# Patient Record
Sex: Male | Born: 1945 | Race: White | Hispanic: No | Marital: Married | Smoking: Never smoker
Health system: Southern US, Community
[De-identification: ages and names within clinical notes are randomized; demographics above are authoritative.]

## PROBLEM LIST (undated history)

## (undated) DIAGNOSIS — I4510 Unspecified right bundle-branch block: Secondary | ICD-10-CM

## (undated) DIAGNOSIS — I639 Cerebral infarction, unspecified: Secondary | ICD-10-CM

## (undated) DIAGNOSIS — E119 Type 2 diabetes mellitus without complications: Secondary | ICD-10-CM

## (undated) DIAGNOSIS — E785 Hyperlipidemia, unspecified: Secondary | ICD-10-CM

## (undated) DIAGNOSIS — E042 Nontoxic multinodular goiter: Secondary | ICD-10-CM

## (undated) DIAGNOSIS — I10 Essential (primary) hypertension: Secondary | ICD-10-CM

## (undated) HISTORY — DX: Cerebral infarction, unspecified: I63.9

## (undated) HISTORY — DX: Nontoxic multinodular goiter: E04.2

## (undated) HISTORY — PX: SPINE SURGERY: SHX786

## (undated) HISTORY — DX: Unspecified right bundle-branch block: I45.10

## (undated) HISTORY — PX: TONSILLECTOMY: SUR1361

## (undated) HISTORY — DX: Type 2 diabetes mellitus without complications: E11.9

## (undated) HISTORY — DX: Essential (primary) hypertension: I10

## (undated) HISTORY — DX: Hyperlipidemia, unspecified: E78.5

---

## 1998-04-12 DIAGNOSIS — I639 Cerebral infarction, unspecified: Secondary | ICD-10-CM

## 1998-04-12 HISTORY — DX: Cerebral infarction, unspecified: I63.9

## 2009-03-11 ENCOUNTER — Encounter: Admission: RE | Admit: 2009-03-11 | Discharge: 2009-03-11 | Payer: Self-pay | Admitting: Family Medicine

## 2009-12-17 ENCOUNTER — Encounter: Admission: RE | Admit: 2009-12-17 | Discharge: 2009-12-17 | Payer: Self-pay | Admitting: Family Medicine

## 2009-12-29 ENCOUNTER — Encounter: Admission: RE | Admit: 2009-12-29 | Discharge: 2009-12-29 | Payer: Self-pay | Admitting: Family Medicine

## 2010-08-10 ENCOUNTER — Ambulatory Visit: Payer: Federal, State, Local not specified - PPO | Attending: Family Medicine | Admitting: Physical Therapy

## 2010-08-10 DIAGNOSIS — R269 Unspecified abnormalities of gait and mobility: Secondary | ICD-10-CM | POA: Insufficient documentation

## 2010-08-10 DIAGNOSIS — R262 Difficulty in walking, not elsewhere classified: Secondary | ICD-10-CM | POA: Insufficient documentation

## 2010-08-10 DIAGNOSIS — IMO0001 Reserved for inherently not codable concepts without codable children: Secondary | ICD-10-CM | POA: Insufficient documentation

## 2010-08-10 DIAGNOSIS — M6281 Muscle weakness (generalized): Secondary | ICD-10-CM | POA: Insufficient documentation

## 2010-08-10 DIAGNOSIS — R5381 Other malaise: Secondary | ICD-10-CM | POA: Insufficient documentation

## 2010-09-14 ENCOUNTER — Other Ambulatory Visit: Payer: Self-pay | Admitting: Internal Medicine

## 2010-09-14 DIAGNOSIS — E042 Nontoxic multinodular goiter: Secondary | ICD-10-CM

## 2010-09-17 ENCOUNTER — Ambulatory Visit
Admission: RE | Admit: 2010-09-17 | Discharge: 2010-09-17 | Disposition: A | Discharge status: Home / Self Care | Payer: Federal, State, Local not specified - PPO | Source: Ambulatory Visit | Attending: Internal Medicine | Admitting: Internal Medicine

## 2010-09-17 DIAGNOSIS — E042 Nontoxic multinodular goiter: Secondary | ICD-10-CM

## 2013-12-25 ENCOUNTER — Ambulatory Visit: Payer: Federal, State, Local not specified - PPO

## 2014-01-08 ENCOUNTER — Ambulatory Visit: Payer: Federal, State, Local not specified - PPO | Attending: Family Medicine | Admitting: Physical Therapy

## 2014-01-08 DIAGNOSIS — R269 Unspecified abnormalities of gait and mobility: Secondary | ICD-10-CM | POA: Insufficient documentation

## 2014-01-08 DIAGNOSIS — M25519 Pain in unspecified shoulder: Secondary | ICD-10-CM | POA: Diagnosis not present

## 2014-01-08 DIAGNOSIS — M2560 Stiffness of unspecified joint, not elsewhere classified: Secondary | ICD-10-CM | POA: Diagnosis not present

## 2014-01-08 DIAGNOSIS — IMO0001 Reserved for inherently not codable concepts without codable children: Secondary | ICD-10-CM | POA: Insufficient documentation

## 2014-01-24 ENCOUNTER — Ambulatory Visit: Payer: Federal, State, Local not specified - PPO | Attending: Family Medicine | Admitting: Physical Therapy

## 2014-01-24 DIAGNOSIS — M2560 Stiffness of unspecified joint, not elsewhere classified: Secondary | ICD-10-CM | POA: Diagnosis not present

## 2014-01-24 DIAGNOSIS — M25511 Pain in right shoulder: Secondary | ICD-10-CM | POA: Diagnosis not present

## 2014-01-24 DIAGNOSIS — R269 Unspecified abnormalities of gait and mobility: Secondary | ICD-10-CM | POA: Insufficient documentation

## 2014-01-24 DIAGNOSIS — Z5189 Encounter for other specified aftercare: Secondary | ICD-10-CM | POA: Diagnosis present

## 2014-01-29 ENCOUNTER — Ambulatory Visit: Payer: Federal, State, Local not specified - PPO | Admitting: Physical Therapy

## 2014-01-29 DIAGNOSIS — Z5189 Encounter for other specified aftercare: Secondary | ICD-10-CM | POA: Diagnosis not present

## 2014-01-31 ENCOUNTER — Ambulatory Visit: Payer: Federal, State, Local not specified - PPO | Admitting: Physical Therapy

## 2014-02-05 ENCOUNTER — Encounter: Payer: Federal, State, Local not specified - PPO | Admitting: Physical Therapy

## 2014-02-07 ENCOUNTER — Ambulatory Visit: Payer: Federal, State, Local not specified - PPO | Admitting: Physical Therapy

## 2014-02-12 ENCOUNTER — Encounter: Payer: Federal, State, Local not specified - PPO | Admitting: Physical Therapy

## 2014-02-14 ENCOUNTER — Encounter: Payer: Federal, State, Local not specified - PPO | Admitting: Physical Therapy

## 2014-02-19 ENCOUNTER — Ambulatory Visit: Payer: Federal, State, Local not specified - PPO | Attending: Family Medicine | Admitting: Physical Therapy

## 2014-02-19 DIAGNOSIS — R269 Unspecified abnormalities of gait and mobility: Secondary | ICD-10-CM | POA: Insufficient documentation

## 2014-02-19 DIAGNOSIS — Z5189 Encounter for other specified aftercare: Secondary | ICD-10-CM | POA: Insufficient documentation

## 2014-02-19 DIAGNOSIS — M2560 Stiffness of unspecified joint, not elsewhere classified: Secondary | ICD-10-CM | POA: Diagnosis not present

## 2014-02-19 DIAGNOSIS — M25511 Pain in right shoulder: Secondary | ICD-10-CM | POA: Insufficient documentation

## 2014-02-21 ENCOUNTER — Ambulatory Visit: Payer: Federal, State, Local not specified - PPO | Admitting: Physical Therapy

## 2014-10-03 ENCOUNTER — Ambulatory Visit (INDEPENDENT_AMBULATORY_CARE_PROVIDER_SITE_OTHER): Payer: Federal, State, Local not specified - PPO | Admitting: Neurology

## 2014-10-03 ENCOUNTER — Ambulatory Visit (INDEPENDENT_AMBULATORY_CARE_PROVIDER_SITE_OTHER): Payer: Self-pay | Admitting: Neurology

## 2014-10-03 DIAGNOSIS — G5601 Carpal tunnel syndrome, right upper limb: Secondary | ICD-10-CM

## 2014-10-03 NOTE — Progress Notes (Signed)
  [facility]    Provider:  Dr Lucia Gaskins Referring Provider: Ileana Ladd, MD Primary Care Physician:  [name], MD  HPI:  Charles Welch is a 69 y.o. male here as a referral from Dr. Modesto Charon for right CTS. Symptoms started 16 years ago. He has tingling and numbers in all the fingers. No grip weakness. Symptoms worse at night and early in the morning.   Summary:   Right median motor nerve showed delayed distal onset latency(6.1ms, N<4.3ms) and delayed F Response (36.52ms, N<61ms)  Right median sensory nerve showed prolonged distal peak latency (6.78ms, N<3.9) and reduced amplitude (5uV, N>10)  Right Ulnar ADM motor nerve within normal limits with normal F response latency Right Ulnar 5th digit sensory nerve within normal limits.   Right Radial 5th digit sensory nerve within normal limits.  The Right Lumbrical Comparison Motor nerve showed abnormal onset latency difference (Med Wrist-Uln Wrist, 0.5 ms, N<0.5) with a relative median delay.    Needle evaluation of the right Opponens Pollicis showed moderately increased spontaneous activity(positive sharp waves, fibrillation potentials), increased amplitude and reduced recruitment. The following muscles were within normal limits: right Deltoid, right Triceps, right Pronator teres, right First Dorsal Interosseous, right Extensor Indicis. Could not evaluate cervical  paraspinals due to previous surgery  Conclusion: There electrophysiologic evidence for severe right Carpal Tunnel Syndrome. No suggestion of polyneuropathy or cervical radiculopathy.   Naomie Dean, MD  [facility] [address] [place], [place] [address]  Phone [phone] Fax [phone]

## 2014-10-03 NOTE — Progress Notes (Signed)
See procedure note.

## 2014-10-06 NOTE — Procedures (Signed)
[id number]    Provider:  Dr Lucia Gaskins Referring Provider: Ileana Ladd, MD Primary Care Physician:  [name], MD  HPI:  Charles Welch is a 69 y.o. male here as a referral from Dr. Modesto Charon for right CTS. Symptoms started 16 years ago. He has tingling and numbers in all the fingers. No grip weakness. Symptoms worse at night and early in the morning.   Summary:   Right median motor nerve showed delayed distal onset latency(6.2ms, N<4.49ms) and delayed F Response (36.73ms, N<77ms)  Right median sensory nerve showed prolonged distal peak latency (6.52ms, N<3.9) and reduced amplitude (5uV, N>10)  Right Ulnar ADM motor nerve within normal limits with normal F response latency Right Ulnar 5th digit sensory nerve within normal limits.   Right Radial 5th digit sensory nerve within normal limits.  The Right Lumbrical Comparison Motor nerve showed abnormal onset latency difference (Med Wrist-Uln Wrist, 0.5 ms, N<0.5) with a relative median delay.    Needle evaluation of the right Opponens Pollicis showed moderately increased spontaneous activity(positive sharp waves, fibrillation potentials), increased amplitude and reduced recruitment. The following muscles were within normal limits: right Deltoid, right Triceps, right Pronator teres, right First Dorsal Interosseous, right Extensor Indicis. Could not evaluate cervical  paraspinals due to previous surgery  Conclusion: There electrophysiologic evidence for severe right Carpal Tunnel Syndrome. No suggestion of polyneuropathy or cervical radiculopathy.   Naomie Dean, MD  [facility] [address] [place], [place] [address]  Phone [phone] Fax [phone]

## 2015-06-30 ENCOUNTER — Ambulatory Visit: Payer: Federal, State, Local not specified - PPO | Admitting: Endocrinology

## 2015-07-29 ENCOUNTER — Encounter: Payer: Self-pay | Admitting: Internal Medicine

## 2015-07-29 ENCOUNTER — Other Ambulatory Visit (INDEPENDENT_AMBULATORY_CARE_PROVIDER_SITE_OTHER): Payer: Federal, State, Local not specified - PPO | Admitting: *Deleted

## 2015-07-29 ENCOUNTER — Ambulatory Visit (INDEPENDENT_AMBULATORY_CARE_PROVIDER_SITE_OTHER): Payer: Federal, State, Local not specified - PPO | Admitting: Internal Medicine

## 2015-07-29 VITALS — BP 154/74 | HR 85 | Temp 98.0°F | Resp 12 | Ht 76.0 in | Wt 176.0 lb

## 2015-07-29 DIAGNOSIS — E1159 Type 2 diabetes mellitus with other circulatory complications: Secondary | ICD-10-CM | POA: Diagnosis not present

## 2015-07-29 DIAGNOSIS — E118 Type 2 diabetes mellitus with unspecified complications: Secondary | ICD-10-CM

## 2015-07-29 DIAGNOSIS — IMO0002 Reserved for concepts with insufficient information to code with codable children: Secondary | ICD-10-CM | POA: Insufficient documentation

## 2015-07-29 DIAGNOSIS — E1165 Type 2 diabetes mellitus with hyperglycemia: Secondary | ICD-10-CM

## 2015-07-29 LAB — POCT GLYCOSYLATED HEMOGLOBIN (HGB A1C): Hemoglobin A1C: 10

## 2015-07-29 NOTE — Progress Notes (Signed)
Patient ID: Charles Welch, male   DOB: [birth year], 70 y.o.   MRN: [id number]  HPI: Charles Welch is a 70 y.o.-year-old male, referred by his PCP, Dr. Modesto Charon, for management of DM2, dx in 1990s, non-insulin-dependent, uncontrolled, with complications (cerebrovascular ds - s/p CVA 2000). Previously seen by Dr. Sharl Ma.  Last hemoglobin A1c was: 04/2015: HbA1c ~8% reportedly 01/30/2015: HbA1c 9.2% 09/12/2014: HbA1c 9.7%  Pt is on a regimen of: - Metformin 500 mg 1x a day at dinnertime - Glipizide 20 mg in am and 10 mg after dinner - Actos 15 mg daily  He was on Lantus as too expensive. Reviewing records from PCP, he was advised to start long-acting insulin at least back in 2012. He was on Invokana >> swelling of lips He was on Januvia >> swelling of lips  He lives alone.  He tells me he takes 1 graham cracker or 1 cup of applejuice at night to avoid hypoglycemia overnight.  Pt checks his sugars 3x a day and they are: - am: 200-250 (180-190 in am if skips juice at bedtime) - 2h after b'fast: 200s - before lunch: 250s - 2h after lunch: n/c - before dinner: 230-250 - 2h after dinner: n/c - bedtime: ? - nighttime: n/c No lows. Lowest sugar was 125; he has hypoglycemia awareness at 180.  Highest sugar was 400s.  Glucometer: [employer]  Pt's meals are: - Breakfast: cereal or oatmeal - Lunch: sandwich with chicken or lettuce - Dinner: chicken nuggets or meatballs + mashed potatoes - Snacks: none  - no CKD, last BUN/creatinine:  01/30/2015: 15/0.85 No results found for: BUN, CREATININE  On Ramipril. - last set of lipids: 01/30/2015: 138/130/54/58 No results found for: CHOL, HDL, LDLCALC, LDLDIRECT, TRIG, CHOLHDL  On Lipitor. - last eye exam was in 07/2014. No DR.  - no numbness and tingling in his feet.  Pt has no FH of DM.  ROS: Constitutional: no weight gain/loss, + fatigue, no subjective hyperthermia/hypothermia, + poor sleep, + excessive urination and nocturia Eyes:  no blurry vision, no xerophthalmia ENT: no sore throat, no nodules palpated in throat, no dysphagia/odynophagia, + hoarseness, + tinnitus Cardiovascular: no CP/SOB/palpitations/+ leg swelling Respiratory: + cough/no SOB Gastrointestinal: no N/V/D/C Musculoskeletal: + muscle/+ joint aches Skin: no rashes, + easy bruising Neurological: no tremors/numbness/tingling/dizziness Psychiatric: no depression/anxiety + diff with erections, + low libido  Past Medical History  Diagnosis Date  . Hypertension   . Stroke (HCC)     2000, L hemiparesis  . RBBB   . Hyperlipidemia   . Nontoxic multinodular goiter     Past Surgical History  Procedure Laterality Date  . Spine surgery      L3, L4  . Tonsillectomy       Social History   Social History  . Marital Status: single    Spouse Name: N/A  . Number of Children: 0   Occupational History  . retired   Social History Main Topics  . Smoking status: Never Smoker   . Smokeless tobacco: Not on file  . Alcohol Use: No  . Drug Use: No   Current Outpatient Rx  Name  Route  Sig  Dispense  Refill  . aspirin 81 MG tablet   Oral   Take 81 mg by mouth daily.         Marland Kitchen atorvastatin (LIPITOR) 10 MG tablet   Oral   Take 10 mg by mouth daily at 6 PM.          .  BAYER CONTOUR NEXT TEST test strip   Other   1 each by Other route daily.            Dispense as written.   . carvedilol (COREG) 12.5 MG tablet   Oral   Take 12.5 mg by mouth 2 (two) times daily with a meal.      0   . clopidogrel (PLAVIX) 75 MG tablet   Oral   Take 75 mg by mouth once.          Marland Kitchen. glipiZIDE (GLUCOTROL) 10 MG tablet   Oral   Take 20 mg by mouth daily before breakfast and 10 mg after dinner - despite advice to take 10 mg bid        . hydrochlorothiazide (HYDRODIURIL) 25 MG tablet   Oral   Take 25 mg by mouth daily.          . metFORMIN (GLUCOPHAGE) 500 MG tablet   Oral   Take 500 mg by mouth  daily after a meal.         . Multiple  Vitamins-Minerals (MULTIVITAMIN & MINERAL PO)   Oral   Take 1 tablet by mouth daily.          Marland Kitchen. NIFEdipine (PROCARDIA-XL/ADALAT-CC/NIFEDICAL-XL) 30 MG 24 hr tablet   Oral   Take 30 mg by mouth daily.          . pioglitazone (ACTOS) 15 MG tablet   Oral   Take 15 mg by mouth daily.         . ramipril (ALTACE) 10 MG capsule   Oral   Take 20 mg by mouth daily.            Allergies  Allergen Reactions  . Canagliflozin Swelling  . Clarithromycin Other (See Comments)  . Hydrochlorothiazide Other (See Comments)  . Penicillin G Itching  . Sitagliptin Other (See Comments)   Family History  Problem Relation Age of Onset  . Stroke Mother   . Heart disease Maternal Grandmother    PE: BP 154/74 mmHg  Pulse 85  Temp(Src) 98 F (36.7 C) (Oral)  Resp 12  Ht 6\' 4"  (1.93 m)  Wt 176 lb (79.833 kg)  BMI 21.43 kg/m2  SpO2 95% Wt Readings from Last 3 Encounters:  07/29/15 176 lb (79.833 kg)   Constitutional: normal weight, in NAD, in wheelchair Eyes: PERRLA, EOMI, no exophthalmos ENT: moist mucous membranes, no thyromegaly, no cervical lymphadenopathy Cardiovascular: RRR, No MRG Respiratory: CTA B Gastrointestinal: abdomen soft, NT, ND, BS+ Musculoskeletal: no deformities, strength intact in all 4 Skin: moist, warm, no rashes Neurological: no tremor with outstretched hands, DTR not checked, + L hemiplegia, 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 DM2, non-insulin-dependent, uncontrolled, with complications - cerebrovascular ds, s/p stroke 2000  PLAN:  1. Patient with long-standing, uncontrolled diabetes, on oral antidiabetic regimen, which became insufficient. He was advised to start insulin by PCP, however, he did not start due to high price of Lantus. We discussed about trying to optimize his oral medicines before starting insulin, since he is reticent to do this. For now, since almost all sugars are higher than 200, without particular pattern, will increase metformin to target  dose and will move glipizide before meals. We'll continue Actos 15 mg daily. At next visit, we probably need to start long-acting insulin, however, this may need to be in the form of NPH pen. We did discuss about insulin pens at this visit. - I suggested to:  Patient Instructions  Please increase Metformin to 500 mg 2x a day (with meals) for the next 2 days, then continue with 1000 mg 2x a day.  Decrease Glipizide to 10 mg 2x a day, and move them before meals.  Continue Actos 15 mg daily in am.  Please return in 1.5 months with your sugar log.   - Strongly advised him to start checking sugars at different times of the day - check 2 times a day, rotating checks - given sugar log, however, he has difficulty writing, and prefers to bring his meter for download, which would be fine, but I advised him to document carefully if the sugars were taken before after a meal  - given foot care handout and explained the principles  - given instructions for hypoglycemia management "15-15 rule"  - advised for yearly eye exams. He will have one soon. - checked HbA1c today >> 10% (higher) - Return to clinic in 1.5 mo with sugar log

## 2015-07-29 NOTE — Patient Instructions (Signed)
Please increase Metformin to 500 mg 2x a day (with meals) for the next 2 days, then continue with 1000 mg 2x a day.  Decrease Glipizide to 10 mg 2x a day, and move them before meals.  Continue Actos 15 mg daily in am.  Please return in 1.5 months with your sugar log.   PATIENT INSTRUCTIONS FOR TYPE 2 DIABETES:  **Please join MyChart!** - see attached instructions about how to join if you have not done so already.  DIET AND EXERCISE Diet and exercise is an important part of diabetic treatment.  We recommended aerobic exercise in the form of brisk walking (working between 40-60% of maximal aerobic capacity, similar to brisk walking) for 150 minutes per week (such as 30 minutes five days per week) along with 3 times per week performing 'resistance' training (using various gauge rubber tubes with handles) 5-10 exercises involving the major muscle groups (upper body, lower body and core) performing 10-15 repetitions (or near fatigue) each exercise. Start at half the above goal but build slowly to reach the above goals. If limited by weight, joint pain, or disability, we recommend daily walking in a swimming pool with water up to waist to reduce pressure from joints while allow for adequate exercise.    BLOOD GLUCOSES Monitoring your blood glucoses is important for continued management of your diabetes. Please check your blood glucoses 2-4 times a day: fasting, before meals and at bedtime (you can rotate these measurements - e.g. one day check before the 3 meals, the next day check before 2 of the meals and before bedtime, etc.).   HYPOGLYCEMIA (low blood sugar) Hypoglycemia is usually a reaction to not eating, exercising, or taking too much insulin/ other diabetes drugs.  Symptoms include tremors, sweating, hunger, confusion, headache, etc. Treat IMMEDIATELY with 15 grams of Carbs: . 4 glucose tablets .  cup regular juice/soda . 2 tablespoons raisins . 4 teaspoons sugar . 1 tablespoon  honey Recheck blood glucose in 15 mins and repeat above if still symptomatic/blood glucose <100.  RECOMMENDATIONS TO REDUCE YOUR RISK OF DIABETIC COMPLICATIONS: * Take your prescribed MEDICATION(S) * Follow a DIABETIC diet: Complex carbs, fiber rich foods, (monounsaturated and polyunsaturated) fats * AVOID saturated/trans fats, high fat foods, >2,300 mg salt per day. * EXERCISE at least 5 times a week for 30 minutes or preferably daily.  * DO NOT SMOKE OR DRINK more than 1 drink a day. * Check your FEET every day. Do not wear tightfitting shoes. Contact us if you develop an ulcer * See your EYE doctor once a year or more if needed * Get a FLU shot once a year * Get a PNEUMONIA vaccine once before and once after age 36 years  GOALS:  * Your Hemoglobin A1c of <7%  * fasting sugars need to be <130 * after meals sugars need to be <180 (2h after you start eating) * Your Systolic BP should be 140 or lower  * Your Diastolic BP should be 80 or lower  * Your HDL (Good Cholesterol) should be 40 or higher  * Your LDL (Bad Cholesterol) should be 100 or lower. * Your Triglycerides should be 150 or lower  * Your Urine microalbumin (kidney function) should be <30 * Your Body Mass Index should be 25 or lower    Please consider the following ways to cut down carbs and fat and increase fiber and micronutrients in your diet: - substitute whole grain for white bread or pasta - substitute brown rice  for white rice - substitute 90-calorie flat bread pieces for slices of bread when possible - substitute sweet potatoes or yams for white potatoes - substitute humus for margarine - substitute tofu for cheese when possible - substitute almond or rice milk for regular milk (would not drink soy milk daily due to concern for soy estrogen influence on breast cancer risk) - substitute dark chocolate for other sweets when possible - substitute water - can add lemon or orange slices for taste - for diet sodas  (artificial sweeteners will trick your body that you can eat sweets without getting calories and will lead you to overeating and weight gain in the long run) - do not skip breakfast or other meals (this will slow down the metabolism and will result in more weight gain over time)  - can try smoothies made from fruit and almond/rice milk in am instead of regular breakfast - can also try old-fashioned (not instant) oatmeal made with almond/rice milk in am - order the dressing on the side when eating salad at a restaurant (pour less than half of the dressing on the salad) - eat as [place] meat as possible - can try juicing, but should not forget that juicing will get rid of the fiber, so would alternate with eating raw veg./fruits or drinking smoothies - use as [place] oil as possible, even when using olive oil - can dress a salad with a mix of balsamic vinegar and lemon juice, for e.g. - use agave nectar, stevia sugar, or regular sugar rather than artificial sweateners - steam or broil/roast veggies  - snack on veggies/fruit/nuts (unsalted, preferably) when possible, rather than processed foods - reduce or eliminate aspartame in diet (it is in diet sodas, chewing gum, etc) Read the labels!  Try to read Dr. Katherina RightNeal Barnard's book: "Program for Reversing Diabetes" for other ideas for healthy eating.

## 2015-07-30 ENCOUNTER — Encounter: Payer: Self-pay | Admitting: Internal Medicine

## 2015-09-18 ENCOUNTER — Ambulatory Visit: Payer: Federal, State, Local not specified - PPO | Admitting: Internal Medicine

## 2015-11-10 ENCOUNTER — Ambulatory Visit: Payer: Federal, State, Local not specified - PPO | Admitting: Internal Medicine

## 2016-04-12 ENCOUNTER — Inpatient Hospital Stay (HOSPITAL_COMMUNITY): Payer: Medicare Other | Admitting: Anesthesiology

## 2016-04-12 ENCOUNTER — Encounter (HOSPITAL_COMMUNITY)
Admission: EM | Disposition: A | Discharge status: Skilled Nursing Facility | Payer: Self-pay | Source: Home / Self Care | Attending: Internal Medicine

## 2016-04-12 ENCOUNTER — Emergency Department (HOSPITAL_COMMUNITY): Payer: Medicare Other

## 2016-04-12 ENCOUNTER — Inpatient Hospital Stay (HOSPITAL_COMMUNITY): Payer: Medicare Other

## 2016-04-12 ENCOUNTER — Inpatient Hospital Stay (HOSPITAL_COMMUNITY)
Admission: EM | Admit: 2016-04-12 | Discharge: 2016-04-15 | DRG: 481 | Disposition: A | Discharge status: Skilled Nursing Facility | Payer: Medicare Other | Attending: Internal Medicine | Admitting: Internal Medicine

## 2016-04-12 ENCOUNTER — Encounter (HOSPITAL_COMMUNITY): Payer: Self-pay | Admitting: Oncology

## 2016-04-12 DIAGNOSIS — Y9301 Activity, walking, marching and hiking: Secondary | ICD-10-CM | POA: Diagnosis present

## 2016-04-12 DIAGNOSIS — R296 Repeated falls: Secondary | ICD-10-CM | POA: Diagnosis present

## 2016-04-12 DIAGNOSIS — Z7984 Long term (current) use of oral hypoglycemic drugs: Secondary | ICD-10-CM | POA: Diagnosis not present

## 2016-04-12 DIAGNOSIS — Z79899 Other long term (current) drug therapy: Secondary | ICD-10-CM | POA: Diagnosis not present

## 2016-04-12 DIAGNOSIS — I10 Essential (primary) hypertension: Secondary | ICD-10-CM

## 2016-04-12 DIAGNOSIS — E876 Hypokalemia: Secondary | ICD-10-CM | POA: Diagnosis present

## 2016-04-12 DIAGNOSIS — E1159 Type 2 diabetes mellitus with other circulatory complications: Secondary | ICD-10-CM

## 2016-04-12 DIAGNOSIS — Z7982 Long term (current) use of aspirin: Secondary | ICD-10-CM | POA: Diagnosis not present

## 2016-04-12 DIAGNOSIS — S72002A Fracture of unspecified part of neck of left femur, initial encounter for closed fracture: Secondary | ICD-10-CM | POA: Diagnosis not present

## 2016-04-12 DIAGNOSIS — E1165 Type 2 diabetes mellitus with hyperglycemia: Secondary | ICD-10-CM | POA: Diagnosis present

## 2016-04-12 DIAGNOSIS — T502X5A Adverse effect of carbonic-anhydrase inhibitors, benzothiadiazides and other diuretics, initial encounter: Secondary | ICD-10-CM | POA: Diagnosis present

## 2016-04-12 DIAGNOSIS — D72828 Other elevated white blood cell count: Secondary | ICD-10-CM | POA: Diagnosis present

## 2016-04-12 DIAGNOSIS — W1830XA Fall on same level, unspecified, initial encounter: Secondary | ICD-10-CM | POA: Diagnosis present

## 2016-04-12 DIAGNOSIS — S72142A Displaced intertrochanteric fracture of left femur, initial encounter for closed fracture: Principal | ICD-10-CM

## 2016-04-12 DIAGNOSIS — S72009A Fracture of unspecified part of neck of unspecified femur, initial encounter for closed fracture: Secondary | ICD-10-CM | POA: Diagnosis present

## 2016-04-12 DIAGNOSIS — E785 Hyperlipidemia, unspecified: Secondary | ICD-10-CM | POA: Diagnosis present

## 2016-04-12 DIAGNOSIS — D72829 Elevated white blood cell count, unspecified: Secondary | ICD-10-CM

## 2016-04-12 DIAGNOSIS — M21372 Foot drop, left foot: Secondary | ICD-10-CM | POA: Diagnosis present

## 2016-04-12 DIAGNOSIS — Z419 Encounter for procedure for purposes other than remedying health state, unspecified: Secondary | ICD-10-CM

## 2016-04-12 DIAGNOSIS — Z7902 Long term (current) use of antithrombotics/antiplatelets: Secondary | ICD-10-CM | POA: Diagnosis not present

## 2016-04-12 DIAGNOSIS — M79605 Pain in left leg: Secondary | ICD-10-CM | POA: Diagnosis present

## 2016-04-12 DIAGNOSIS — Z8673 Personal history of transient ischemic attack (TIA), and cerebral infarction without residual deficits: Secondary | ICD-10-CM

## 2016-04-12 DIAGNOSIS — D62 Acute posthemorrhagic anemia: Secondary | ICD-10-CM | POA: Diagnosis not present

## 2016-04-12 DIAGNOSIS — D509 Iron deficiency anemia, unspecified: Secondary | ICD-10-CM | POA: Diagnosis present

## 2016-04-12 HISTORY — PX: INTRAMEDULLARY (IM) NAIL INTERTROCHANTERIC: SHX5875

## 2016-04-12 LAB — CK: Total CK: 123 U/L (ref 49–397)

## 2016-04-12 LAB — GLUCOSE, CAPILLARY
GLUCOSE-CAPILLARY: 271 mg/dL — AB (ref 65–99)
GLUCOSE-CAPILLARY: 284 mg/dL — AB (ref 65–99)
Glucose-Capillary: 236 mg/dL — ABNORMAL HIGH (ref 65–99)
Glucose-Capillary: 208 mg/dL — ABNORMAL HIGH (ref 65–99)

## 2016-04-12 LAB — BASIC METABOLIC PANEL
Anion gap: 13 (ref 5–15)
BUN: 17 mg/dL (ref 6–20)
CALCIUM: 9.4 mg/dL (ref 8.9–10.3)
CO2: 28 mmol/L (ref 22–32)
CREATININE: 0.91 mg/dL (ref 0.61–1.24)
Chloride: 95 mmol/L — ABNORMAL LOW (ref 101–111)
GFR calc non Af Amer: >60 mL/min (ref >60)
Glucose, Bld: 372 mg/dL — ABNORMAL HIGH (ref 65–99)
Potassium: 3.3 mmol/L — ABNORMAL LOW (ref 3.5–5.1)
SODIUM: 136 mmol/L (ref 135–145)

## 2016-04-12 LAB — CBC WITH DIFFERENTIAL/PLATELET
BASOS PCT: 0 %
Basophils Absolute: 0 10*3/uL (ref 0.0–0.1)
EOS ABS: 0 10*3/uL (ref 0.0–0.7)
EOS PCT: 0 %
HCT: 39.4 % (ref 39.0–52.0)
HEMOGLOBIN: 13.6 g/dL (ref 13.0–17.0)
Lymphocytes Relative: 5 %
Lymphs Abs: 1.1 10*3/uL (ref 0.7–4.0)
MCH: 32.1 pg (ref 26.0–34.0)
MCHC: 34.5 g/dL (ref 30.0–36.0)
MCV: 92.9 fL (ref 78.0–100.0)
Monocytes Absolute: 0.8 10*3/uL (ref 0.1–1.0)
Monocytes Relative: 4 %
NEUTROS PCT: 91 %
Neutro Abs: 18.6 10*3/uL — ABNORMAL HIGH (ref 1.7–7.7)
PLATELETS: 362 10*3/uL (ref 150–400)
RBC: 4.24 MIL/uL (ref 4.22–5.81)
RDW: 12.7 % (ref 11.5–15.5)
WBC: 20.5 10*3/uL — AB (ref 4.0–10.5)

## 2016-04-12 LAB — TYPE AND SCREEN
ABO/RH(D): A POS
ANTIBODY SCREEN: NEGATIVE

## 2016-04-12 LAB — PROTIME-INR
INR: 1.03
PROTHROMBIN TIME: 13.6 s (ref 11.4–15.2)

## 2016-04-12 LAB — ABO/RH: ABO/RH(D): A POS

## 2016-04-12 SURGERY — FIXATION, FRACTURE, INTERTROCHANTERIC, WITH INTRAMEDULLARY ROD
Anesthesia: General | Laterality: Left

## 2016-04-12 MED ORDER — METOCLOPRAMIDE HCL 5 MG PO TABS: 5.0000 mg | ORAL_TABLET | Freq: Three times a day (TID) | ORAL | Status: DC | PRN

## 2016-04-12 MED ORDER — POTASSIUM CHLORIDE 10 MEQ/100ML IV SOLN
10.0000 meq | INTRAVENOUS | Status: DC
  Administered 2016-04-12 (×2): 10 meq via INTRAVENOUS
  Filled 2016-04-12 (×4): qty 100

## 2016-04-12 MED ORDER — FENTANYL CITRATE (PF) 100 MCG/2ML IJ SOLN
50.0000 ug | INTRAMUSCULAR | Status: DC | PRN
  Administered 2016-04-12: 50 ug via INTRAVENOUS
  Filled 2016-04-12: qty 2

## 2016-04-12 MED ORDER — ALUM & MAG HYDROXIDE-SIMETH 200-200-20 MG/5ML PO SUSP: 30.0000 mL | ORAL | Status: DC | PRN

## 2016-04-12 MED ORDER — NIFEDIPINE ER 30 MG PO TB24
30.0000 mg | ORAL_TABLET | Freq: Every day | ORAL | Status: DC
  Administered 2016-04-13 – 2016-04-15 (×3): 30 mg via ORAL
  Filled 2016-04-12 (×4): qty 1

## 2016-04-12 MED ORDER — OXYCODONE HCL 5 MG/5ML PO SOLN
5.0000 mg | Freq: Once | ORAL | Status: DC | PRN
  Filled 2016-04-12: qty 5

## 2016-04-12 MED ORDER — RAMIPRIL 10 MG PO CAPS
10.0000 mg | ORAL_CAPSULE | Freq: Two times a day (BID) | ORAL | Status: DC
  Administered 2016-04-13: 10 mg via ORAL
  Filled 2016-04-12 (×3): qty 1

## 2016-04-12 MED ORDER — ROCURONIUM BROMIDE 10 MG/ML (PF) SYRINGE
PREFILLED_SYRINGE | INTRAVENOUS | Status: DC | PRN
  Administered 2016-04-12: 25 mg via INTRAVENOUS

## 2016-04-12 MED ORDER — OXYCODONE HCL 5 MG PO TABS: 5.0000 mg | ORAL_TABLET | Freq: Once | ORAL | Status: DC | PRN

## 2016-04-12 MED ORDER — OXYCODONE HCL 5 MG PO TABS
5.0000 mg | ORAL_TABLET | ORAL | Status: DC | PRN
  Administered 2016-04-12: 5 mg via ORAL
  Filled 2016-04-12: qty 1

## 2016-04-12 MED ORDER — FENTANYL CITRATE (PF) 100 MCG/2ML IJ SOLN
INTRAMUSCULAR | Status: AC
  Filled 2016-04-12: qty 2

## 2016-04-12 MED ORDER — HYDROCHLOROTHIAZIDE 25 MG PO TABS
25.0000 mg | ORAL_TABLET | Freq: Every day | ORAL | Status: DC
  Administered 2016-04-12 – 2016-04-13 (×2): 25 mg via ORAL
  Filled 2016-04-12 (×2): qty 1

## 2016-04-12 MED ORDER — POLYETHYLENE GLYCOL 3350 17 G PO PACK: 17.0000 g | PACK | Freq: Every day | ORAL | Status: DC | PRN

## 2016-04-12 MED ORDER — SUCCINYLCHOLINE CHLORIDE 200 MG/10ML IV SOSY
PREFILLED_SYRINGE | INTRAVENOUS | Status: DC | PRN
  Administered 2016-04-12: 80 mg via INTRAVENOUS

## 2016-04-12 MED ORDER — CARVEDILOL 12.5 MG PO TABS
12.5000 mg | ORAL_TABLET | Freq: Two times a day (BID) | ORAL | Status: DC
  Administered 2016-04-12 – 2016-04-15 (×6): 12.5 mg via ORAL
  Filled 2016-04-12 (×6): qty 1

## 2016-04-12 MED ORDER — SUGAMMADEX SODIUM 200 MG/2ML IV SOLN
INTRAVENOUS | Status: DC | PRN
  Administered 2016-04-12: 160 mg via INTRAVENOUS

## 2016-04-12 MED ORDER — ACETAMINOPHEN 325 MG PO TABS
650.0000 mg | ORAL_TABLET | Freq: Four times a day (QID) | ORAL | Status: DC | PRN
  Administered 2016-04-12 – 2016-04-15 (×6): 650 mg via ORAL
  Filled 2016-04-12 (×6): qty 2

## 2016-04-12 MED ORDER — DEXTROSE-NACL 5-0.45 % IV SOLN: INTRAVENOUS | Status: DC

## 2016-04-12 MED ORDER — ONDANSETRON HCL 4 MG/2ML IJ SOLN: 4.0000 mg | Freq: Four times a day (QID) | INTRAMUSCULAR | Status: DC | PRN

## 2016-04-12 MED ORDER — SODIUM CHLORIDE 0.9 % IV SOLN
INTRAVENOUS | Status: DC
  Administered 2016-04-12: 05:00:00 via INTRAVENOUS

## 2016-04-12 MED ORDER — FENTANYL CITRATE (PF) 100 MCG/2ML IJ SOLN
INTRAMUSCULAR | Status: DC | PRN
  Administered 2016-04-12: 50 ug via INTRAVENOUS
  Administered 2016-04-12 (×2): 25 ug via INTRAVENOUS

## 2016-04-12 MED ORDER — PHENYLEPHRINE HCL 10 MG/ML IJ SOLN
INTRAVENOUS | Status: DC | PRN
  Administered 2016-04-12: 40 ug/min via INTRAVENOUS

## 2016-04-12 MED ORDER — HYDROCORTISONE 2.5 % RE CREA: 1.0000 "application " | TOPICAL_CREAM | Freq: Two times a day (BID) | RECTAL | Status: DC | PRN

## 2016-04-12 MED ORDER — LIP MEDEX EX OINT
TOPICAL_OINTMENT | Freq: Once | CUTANEOUS | Status: DC
  Filled 2016-04-12 (×3): qty 7

## 2016-04-12 MED ORDER — CEFAZOLIN SODIUM-DEXTROSE 2-3 GM-% IV SOLR
INTRAVENOUS | Status: DC | PRN
  Administered 2016-04-12: 2 g via INTRAVENOUS

## 2016-04-12 MED ORDER — ONDANSETRON HCL 4 MG/2ML IJ SOLN
INTRAMUSCULAR | Status: DC | PRN
  Administered 2016-04-12: 4 mg via INTRAVENOUS

## 2016-04-12 MED ORDER — HYDROCODONE-ACETAMINOPHEN 5-325 MG PO TABS
1.0000 | ORAL_TABLET | Freq: Four times a day (QID) | ORAL | Status: DC | PRN
  Administered 2016-04-14 (×3): 1 via ORAL
  Filled 2016-04-12 (×3): qty 1

## 2016-04-12 MED ORDER — PHENYLEPHRINE HCL 10 MG/ML IJ SOLN
INTRAMUSCULAR | Status: DC | PRN
  Administered 2016-04-12: 80 ug via INTRAVENOUS
  Administered 2016-04-12: 40 ug via INTRAVENOUS
  Administered 2016-04-12: 80 ug via INTRAVENOUS

## 2016-04-12 MED ORDER — ACETAMINOPHEN 650 MG RE SUPP
650.0000 mg | Freq: Four times a day (QID) | RECTAL | Status: DC | PRN
Start: 2016-04-12 — End: 2016-04-15

## 2016-04-12 MED ORDER — INSULIN ASPART 100 UNIT/ML ~~LOC~~ SOLN
0.0000 [IU] | SUBCUTANEOUS | Status: DC
  Administered 2016-04-12: 16:00:00 8 [IU] via SUBCUTANEOUS
  Administered 2016-04-12 – 2016-04-13 (×5): 5 [IU] via SUBCUTANEOUS
  Administered 2016-04-13 (×2): 2 [IU] via SUBCUTANEOUS
  Administered 2016-04-14: 3 [IU] via SUBCUTANEOUS
  Administered 2016-04-14: 05:00:00 2 [IU] via SUBCUTANEOUS
  Administered 2016-04-14: 15:00:00 5 [IU] via SUBCUTANEOUS

## 2016-04-12 MED ORDER — METHOCARBAMOL 500 MG PO TABS
500.0000 mg | ORAL_TABLET | Freq: Four times a day (QID) | ORAL | Status: DC | PRN
  Administered 2016-04-12 – 2016-04-15 (×6): 500 mg via ORAL
  Filled 2016-04-12 (×6): qty 1

## 2016-04-12 MED ORDER — 0.9 % SODIUM CHLORIDE (POUR BTL) OPTIME
TOPICAL | Status: DC | PRN
  Administered 2016-04-12: 1000 mL

## 2016-04-12 MED ORDER — FENTANYL CITRATE (PF) 100 MCG/2ML IJ SOLN
25.0000 ug | INTRAMUSCULAR | Status: DC | PRN
  Administered 2016-04-12 (×2): 25 ug via INTRAVENOUS

## 2016-04-12 MED ORDER — FENTANYL CITRATE (PF) 100 MCG/2ML IJ SOLN
INTRAMUSCULAR | Status: AC
Start: 2016-04-12 — End: 2016-04-12
  Administered 2016-04-12: 25 ug via INTRAVENOUS
  Filled 2016-04-12: qty 2

## 2016-04-12 MED ORDER — HYDROCODONE-ACETAMINOPHEN 5-325 MG PO TABS: 1.0000 | ORAL_TABLET | Freq: Four times a day (QID) | ORAL | Status: DC | PRN

## 2016-04-12 MED ORDER — FENTANYL CITRATE (PF) 100 MCG/2ML IJ SOLN
50.0000 ug | INTRAMUSCULAR | Status: AC | PRN
  Administered 2016-04-12 (×2): 50 ug via INTRAVENOUS
  Filled 2016-04-12 (×2): qty 2

## 2016-04-12 MED ORDER — LACTATED RINGERS IV SOLN: INTRAVENOUS | Status: DC

## 2016-04-12 MED ORDER — MORPHINE SULFATE (PF) 2 MG/ML IV SOLN: 0.5000 mg | INTRAVENOUS | Status: DC | PRN

## 2016-04-12 MED ORDER — PHENOL 1.4 % MT LIQD: 1.0000 | OROMUCOSAL | Status: DC | PRN

## 2016-04-12 MED ORDER — INSULIN ASPART 100 UNIT/ML ~~LOC~~ SOLN
10.0000 [IU] | Freq: Once | SUBCUTANEOUS | Status: AC
  Administered 2016-04-12: 10 [IU] via SUBCUTANEOUS
  Filled 2016-04-12: qty 1

## 2016-04-12 MED ORDER — LACTATED RINGERS IV SOLN
INTRAVENOUS | Status: DC | PRN
  Administered 2016-04-12: 11:00:00 via INTRAVENOUS

## 2016-04-12 MED ORDER — MENTHOL 3 MG MT LOZG: 1.0000 | LOZENGE | OROMUCOSAL | Status: DC | PRN

## 2016-04-12 MED ORDER — METOCLOPRAMIDE HCL 5 MG/ML IJ SOLN: 5.0000 mg | Freq: Three times a day (TID) | INTRAMUSCULAR | Status: DC | PRN

## 2016-04-12 MED ORDER — CEFAZOLIN SODIUM-DEXTROSE 2-4 GM/100ML-% IV SOLN
INTRAVENOUS | Status: AC
  Filled 2016-04-12: qty 100

## 2016-04-12 MED ORDER — SODIUM CHLORIDE 0.9 % IV SOLN
INTRAVENOUS | Status: DC
  Administered 2016-04-12: 16:00:00 75 mL/h via INTRAVENOUS
  Administered 2016-04-13 (×2): via INTRAVENOUS

## 2016-04-12 MED ORDER — CEFAZOLIN SODIUM-DEXTROSE 2-4 GM/100ML-% IV SOLN
2.0000 g | Freq: Four times a day (QID) | INTRAVENOUS | Status: AC
  Administered 2016-04-12 (×2): 2 g via INTRAVENOUS
  Filled 2016-04-12 (×2): qty 100

## 2016-04-12 MED ORDER — METHOCARBAMOL 1000 MG/10ML IJ SOLN
500.0000 mg | Freq: Four times a day (QID) | INTRAVENOUS | Status: DC | PRN
  Administered 2016-04-12: 500 mg via INTRAVENOUS
  Filled 2016-04-12: qty 5
  Filled 2016-04-12: qty 550

## 2016-04-12 MED ORDER — PROPOFOL 10 MG/ML IV BOLUS
INTRAVENOUS | Status: DC | PRN
  Administered 2016-04-12: 100 mg via INTRAVENOUS

## 2016-04-12 MED ORDER — ONDANSETRON HCL 4 MG PO TABS: 4.0000 mg | ORAL_TABLET | Freq: Four times a day (QID) | ORAL | Status: DC | PRN

## 2016-04-12 MED ORDER — PROPOFOL 10 MG/ML IV BOLUS
INTRAVENOUS | Status: AC
  Filled 2016-04-12: qty 20

## 2016-04-12 SURGICAL SUPPLY — 31 items
BNDG GAUZE ELAST 4 BULKY (GAUZE/BANDAGES/DRESSINGS) ×3 IMPLANT
COVER PERINEAL POST (MISCELLANEOUS) ×3 IMPLANT
DRAPE STERI IOBAN 125X83 (DRAPES) ×3 IMPLANT
DRSG MEPILEX BORDER 4X4 (GAUZE/BANDAGES/DRESSINGS) ×3 IMPLANT
DRSG MEPILEX BORDER 4X8 (GAUZE/BANDAGES/DRESSINGS) ×3 IMPLANT
DRSG XEROFORM 1X8 (GAUZE/BANDAGES/DRESSINGS) ×3 IMPLANT
DURAPREP 26ML APPLICATOR (WOUND CARE) ×3 IMPLANT
ELECT REM PT RETURN 9FT ADLT (ELECTROSURGICAL) ×3
ELECTRODE REM PT RTRN 9FT ADLT (ELECTROSURGICAL) ×1 IMPLANT
FACESHIELD WRAPAROUND (MASK) ×3 IMPLANT
GAUZE XEROFORM 5X9 LF (GAUZE/BANDAGES/DRESSINGS) ×3 IMPLANT
GLOVE BIO SURGEON STRL SZ7.5 (GLOVE) ×3 IMPLANT
GLOVE BIOGEL PI IND STRL 8 (GLOVE) ×1 IMPLANT
GLOVE BIOGEL PI INDICATOR 8 (GLOVE) ×2
GLOVE ECLIPSE 8.0 STRL XLNG CF (GLOVE) ×3 IMPLANT
GOWN STRL REUS W/TWL XL LVL3 (GOWN DISPOSABLE) ×3 IMPLANT
GUIDEPIN 3.2X17.5 THRD DISP (PIN) ×3 IMPLANT
HIP FRAC NAIL LAG SCR 10.5X100 (Orthopedic Implant) ×2 IMPLANT
KIT BASIN OR (CUSTOM PROCEDURE TRAY) ×3 IMPLANT
MANIFOLD NEPTUNE II (INSTRUMENTS) ×3 IMPLANT
NAIL HIP FRACT LT 130D 11X400 (Nail) ×3 IMPLANT
NS IRRIG 1000ML POUR BTL (IV SOLUTION) ×3 IMPLANT
PACK GENERAL/GYN (CUSTOM PROCEDURE TRAY) ×3 IMPLANT
POSITIONER SURGICAL ARM (MISCELLANEOUS) ×3 IMPLANT
SCREW CANN THRD AFF 10.5X100 (Orthopedic Implant) ×1 IMPLANT
STAPLER VISISTAT 35W (STAPLE) ×3 IMPLANT
SUT VIC AB 0 CT1 36 (SUTURE) ×3 IMPLANT
SUT VIC AB 1 CT1 36 (SUTURE) ×3 IMPLANT
SUT VIC AB 2-0 CT1 27 (SUTURE) ×2
SUT VIC AB 2-0 CT1 TAPERPNT 27 (SUTURE) ×1 IMPLANT
TOWEL OR 17X26 10 PK STRL BLUE (TOWEL DISPOSABLE) ×3 IMPLANT

## 2016-04-12 NOTE — ED Provider Notes (Signed)
[facility]-EMERGENCY DEPT Provider Note   CSN: [id number] Arrival date & time: 04/12/16  0321  By signing my name below, I, Charles Welch, attest that this documentation has been prepared under the direction and in the presence of Charles Kaplan, MD. Electronically Signed: Talbert Welch, Scribe. 04/12/16. 3:58 AM.   History   Chief Complaint Chief Complaint  Patient presents with  . Hip Injury    HPI HPI Comments: Charles Welch is a 71 y.o. male with h/o CVA on 07/17/1999, arthritis in hip and knee who presents to the Emergency Department complaining of  Hip and neck pain s/p fall earlier today when he fell backwards earlier tonight. Larey Seat 10 years ago and broke his left arm. His previous stroke effects the left side of his body.He reports no LOC. Takes aspirin and Plavix.     The history is provided by the patient. No language interpreter was used.    Past Medical History:  Diagnosis Date  . CVA (cerebral vascular accident) (HCC) 2000   Left side, Carotid doppler in Sept 2011 showed less thank 50% obstruction  . Diabetes mellitus without complication (HCC)   . Hyperlipidemia   . Hypertension   . Nontoxic multinodular goiter   . Nontoxic multinodular goiter   . RBBB   . RBBB   . Stroke (HCC)    2000, L hemiparesis    Patient Active Problem List   Diagnosis Date Noted  . Hip fracture (HCC) 04/12/2016  . Closed fracture of femur, intertrochanteric, left, initial encounter (HCC) 04/12/2016  . Essential hypertension 04/12/2016  . H/O: CVA (cerebrovascular accident) 04/12/2016  . Poorly controlled type 2 diabetes mellitus with circulatory disorder (HCC) 07/29/2015    Past Surgical History:  Procedure Laterality Date  . SPINE SURGERY     L3, L4  . TONSILLECTOMY         Home Medications    Prior to Admission medications   Medication Sig Start Date End Date Taking? Authorizing Provider  aspirin 81 MG tablet Take 81 mg by mouth daily.   Yes Historical Provider, MD  BAYER  CONTOUR NEXT TEST test strip 1 each by Other route daily.  07/26/15  Yes Historical Provider, MD  carvedilol (COREG) 12.5 MG tablet Take 12.5 mg by mouth 2 (two) times daily with a meal. 06/26/15  Yes Historical Provider, MD  clopidogrel (PLAVIX) 75 MG tablet Take 75 mg by mouth daily.  05/28/15  Yes Historical Provider, MD  glipiZIDE (GLUCOTROL) 10 MG tablet Take 10 mg by mouth daily before breakfast.  07/14/15  Yes Historical Provider, MD  hydrochlorothiazide (HYDRODIURIL) 25 MG tablet Take 25 mg by mouth daily.  07/28/15  Yes Historical Provider, MD  hydrocortisone (ANUSOL-HC) 2.5 % rectal cream Place 1 application rectally 2 (two) times daily as needed for hemorrhoids or itching.   Yes Historical Provider, MD  metFORMIN (GLUCOPHAGE) 500 MG tablet Take 500 mg by mouth 2 (two) times daily after a meal.  06/16/15  Yes Historical Provider, MD  NIFEdipine (PROCARDIA-XL/ADALAT-CC/NIFEDICAL-XL) 30 MG 24 hr tablet Take 30 mg by mouth daily.  05/28/15  Yes Historical Provider, MD  ramipril (ALTACE) 10 MG capsule Take 10 mg by mouth 2 (two) times daily.  05/30/15  Yes Historical Provider, MD    Family History Family History  Problem Relation Age of Onset  . Stroke Mother   . Heart disease Maternal Grandmother   . Pneumonia Father     Social History Social History  Substance Use Topics  . Smoking status:  Never Smoker  . Smokeless tobacco: Never Used  . Alcohol use No     Allergies   Canagliflozin; Clarithromycin; Hydrochlorothiazide; Penicillin g; and Sitagliptin   Review of Systems Review of Systems  Skin: Positive for wound.   A complete 10 system review of systems was obtained and all systems are negative except as noted in the HPI and PMH.    Physical Exam Updated Vital Signs BP 128/68 (BP Location: Right Arm)   Pulse (!) 129   Temp 98.1 F (36.7 C) (Oral)   Resp 18   SpO2 98%   Physical Exam  Constitutional: He is oriented to person, place, and time. He appears well-developed and  well-nourished.  HENT:  Head: Normocephalic and atraumatic.  Cardiovascular: Regular rhythm.   tachycardia  Pulmonary/Chest: Effort normal.  Neurological: He is alert and oriented to person, place, and time.  Skin: Skin is warm and dry.  Psychiatric: He has a normal mood and affect.  Nursing note and vitals reviewed.    ED Treatments / Results   DIAGNOSTIC STUDIES: Oxygen Saturation is 98% on room air, normal by my interpretation.    COORDINATION OF CARE: 3:58 AM Discussed treatment plan with pt at bedside and pt agreed to plan.   Labs (all labs ordered are listed, but only abnormal results are displayed) Labs Reviewed  BASIC METABOLIC PANEL - Abnormal; Notable for the following:       Result Value   Potassium 3.3 (*)    Chloride 95 (*)    Glucose, Bld 372 (*)    All other components within normal limits  CBC WITH DIFFERENTIAL/PLATELET - Abnormal; Notable for the following:    WBC 20.5 (*)    Neutro Abs 18.6 (*)    All other components within normal limits  PROTIME-INR  CK  URINALYSIS, ROUTINE W REFLEX MICROSCOPIC  TYPE AND SCREEN  ABO/RH    EKG  EKG Interpretation  Date/Time:  Monday April 12 2016 04:46:10 EST Ventricular Rate:  133 PR Interval:    QRS Duration: 138 QT Interval:  355 QTC Calculation: 529 R Axis:   97 Text Interpretation:  Sinus tachycardia Ventricular premature complex Prolonged PR interval LAE, consider biatrial enlargement Right bundle branch block No old tracing to compare No acute changes Nonspecific ST and T wave abnormality Sinus tachycardia Confirmed by Rhunette Croft, MD, Janey Genta [phone]) on 04/12/2016 6:45:31 AM       Radiology Dg Chest 1 View  Result Date: 04/12/2016 CLINICAL DATA:  Preoperative evaluation, LEFT hip fracture. EXAM: CHEST 1 VIEW COMPARISON:  None. FINDINGS: Cardiomediastinal silhouette is normal. Mild calcific atherosclerosis. No pleural effusions or focal consolidations. Symmetric prominence of the bilateral anterior first  ribs. Trachea projects midline and there is no pneumothorax. Soft tissue planes and included osseous structures are non-suspicious. IMPRESSION: No acute cardiopulmonary process. Electronically Signed   By: Awilda Metro M.D.   On: 04/12/2016 04:22   Ct Head Wo Contrast  Result Date: 04/12/2016 CLINICAL DATA:  Ground level fall tonight while walking to the bathroom. Anticoagulated. EXAM: CT HEAD WITHOUT CONTRAST CT CERVICAL SPINE WITHOUT CONTRAST TECHNIQUE: Multidetector CT imaging of the head and cervical spine was performed following the standard protocol without intravenous contrast. Multiplanar CT image reconstructions of the cervical spine were also generated. COMPARISON:  None. FINDINGS: CT HEAD FINDINGS Brain: There is no intracranial hemorrhage, mass or evidence of acute infarction. There is remote right MCA infarction with encephalomalacia. There is remote left internal capsule infarction. There is mild generalized atrophy. There is  patchy white matter hypodensity in both cerebral hemispheres, likely chronic small vessel ischemic disease. Vascular: No hyperdense vessel or unexpected calcification. Skull: Normal. Negative for fracture or focal lesion. Sinuses/Orbits: No acute finding. Other: None. CT CERVICAL SPINE FINDINGS Alignment: Normal. Skull base and vertebrae: No acute fracture. No primary bone lesion or focal pathologic process. Soft tissues and spinal canal: No prevertebral fluid or swelling. No visible canal hematoma. Disc levels: There is severe multilevel degenerative central canal stenosis, greatest at C4-5 and C5-6. Facet articulations are intact and well preserved. Upper chest: Negative. Other: None IMPRESSION: 1. Remote right MCA infarction. Remote left internal capsule infarction. Mild generalized atrophy and moderate chronic white matter hypodensity consistent small vessel ischemic disease. No acute intracranial findings. 2. Negative for acute cervical spine fracture appear 3. Severe  multilevel central canal stenosis of the cervical spine, greatest at C4-5 and C5-6. Electronically Signed   By: Ellery Plunkaniel R Mitchell M.D.   On: 04/12/2016 04:40   Ct Cervical Spine Wo Contrast  Result Date: 04/12/2016 CLINICAL DATA:  Ground level fall tonight while walking to the bathroom. Anticoagulated. EXAM: CT HEAD WITHOUT CONTRAST CT CERVICAL SPINE WITHOUT CONTRAST TECHNIQUE: Multidetector CT imaging of the head and cervical spine was performed following the standard protocol without intravenous contrast. Multiplanar CT image reconstructions of the cervical spine were also generated. COMPARISON:  None. FINDINGS: CT HEAD FINDINGS Brain: There is no intracranial hemorrhage, mass or evidence of acute infarction. There is remote right MCA infarction with encephalomalacia. There is remote left internal capsule infarction. There is mild generalized atrophy. There is patchy white matter hypodensity in both cerebral hemispheres, likely chronic small vessel ischemic disease. Vascular: No hyperdense vessel or unexpected calcification. Skull: Normal. Negative for fracture or focal lesion. Sinuses/Orbits: No acute finding. Other: None. CT CERVICAL SPINE FINDINGS Alignment: Normal. Skull base and vertebrae: No acute fracture. No primary bone lesion or focal pathologic process. Soft tissues and spinal canal: No prevertebral fluid or swelling. No visible canal hematoma. Disc levels: There is severe multilevel degenerative central canal stenosis, greatest at C4-5 and C5-6. Facet articulations are intact and well preserved. Upper chest: Negative. Other: None IMPRESSION: 1. Remote right MCA infarction. Remote left internal capsule infarction. Mild generalized atrophy and moderate chronic white matter hypodensity consistent small vessel ischemic disease. No acute intracranial findings. 2. Negative for acute cervical spine fracture appear 3. 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  Electronically Signed   By: Ellery Plunkaniel R Mitchell M.D.   On: 04/12/2016 04:40   Dg Hip Unilat With Pelvis 2-3 Views Left  Result Date: 04/12/2016 CLINICAL DATA:  Larey SeatFell walking to bathroom. LEFT hip pain and deformity. EXAM: DG HIP (WITH OR WITHOUT PELVIS) 2-3V LEFT COMPARISON:  None. FINDINGS: Acute comminuted LEFT femur intertrochanteric fracture with impaction, varus angulation distal bony fragments. No dislocation. Mild bilateral hip osteoarthrosis. Osteopenia without destructive bony lesions. Plastic radiopaque foreign body projects in LEFT abdomen, seen on single view. Moderate vascular calcifications. IMPRESSION: Acute displaced LEFT femur intertrochanteric fracture. No dislocation. Plastic radiopaque foreign body projects in LEFT mid abdomen. Electronically Signed   By: Awilda Metroourtnay  Bloomer M.D.   On: 04/12/2016 04:20    Procedures Procedures (including critical care time)  Medications Ordered in ED Medications  0.9 %  sodium chloride infusion ( Intravenous New Bag/Given 04/12/16 0508)  fentaNYL (SUBLIMAZE) injection 50 mcg (50 mcg Intravenous Given 04/12/16 0846)  lip balm (CARMEX) ointment (not administered)  potassium chloride 10 mEq in 100 mL IVPB (10  mEq Intravenous New Bag/Given 04/12/16 0911)  fentaNYL (SUBLIMAZE) injection 50 mcg (50 mcg Intravenous Given 04/12/16 0614)  insulin aspart (novoLOG) injection 10 Units (10 Units Subcutaneous Given 04/12/16 0801)     Initial Impression / Assessment and Plan / ED Course  I have reviewed the triage vital signs and the nursing notes.  Pertinent labs & imaging results that were available during my care of the patient were reviewed by me and considered in my medical decision making (see chart for details).  Clinical Course     DDx includes: - Mechanical falls - ICH - Fractures - Contusions - Soft tissue injury  Pt has L sided hip pain and has L inter-troch fx. Dr. Rayburn Ma to see. Neurovascularly intact.  Pt also has elevated HR - and I am  not sure why. HR is regular  - but flutter is possible. We will have the admitting team further differentiate.  Final Clinical Impressions(s) / ED Diagnoses   Final diagnoses:  Closed fracture of left hip, initial encounter [facility])    New Prescriptions New Prescriptions   No medications on file   I personally performed the services described in this documentation, which was scribed in my presence. The recorded information has been reviewed and is accurate.     Charles Kaplan, MD 04/12/16 [phone]

## 2016-04-12 NOTE — H&P (Signed)
[facility] History and Physical  RODGERS LIKES AVW:[id number] DOB: [birth year] DOA: 04/12/2016  PCP: [name], MD  Patient coming from: Home  Chief Complaint: Fall, leg pain.  HPI: Charles Welch is a 71 y.o. male with a medical history of CVA, diabetes mellitus, hypertension, presented to emergency department after falling with left leg pain. X-ray did show fracture. Patient states that he was walking and fell backwards. Denies any dizziness prior to the episode. Patient's daughter does state he has been falling a lot lately and has not been taking care of himself. Patient did have a stroke approximately several years ago which leave him with some left-sided weakness however per daughter, patient was rehabilitated well. He has been having left foot drop lately and been using brace. Early patient denies any chest pain, shortness of breath, abdominal pain, nausea or vomiting, diarrhea constipation, recent illness or travel.  ED Course: Left hip x-ray did show displaced fracture, orthopedics consulted. Dorene Sorrow called for admission.  Review of Systems:  All other systems reviewed and are negative.   Past Medical History:  Diagnosis Date  . CVA (cerebral vascular accident) (HCC) 2000   Left side, Carotid doppler in Sept 2011 showed less thank 50% obstruction  . Diabetes mellitus without complication (HCC)   . Hyperlipidemia   . Hypertension   . Nontoxic multinodular goiter   . Nontoxic multinodular goiter   . RBBB   . RBBB   . Stroke (HCC)    2000, L hemiparesis    Past Surgical History:  Procedure Laterality Date  . SPINE SURGERY     L3, L4  . TONSILLECTOMY      Social History:  reports that he has never smoked. He has never used smokeless tobacco. He reports that he does not drink alcohol or use drugs. Lives at home with son in law  Allergies  Allergen Reactions  . Canagliflozin Swelling  . Clarithromycin Other (See Comments)  . Hydrochlorothiazide Other  (See Comments)  . Penicillin G Itching    Has patient had a PCN reaction causing immediate rash, facial/tongue/throat swelling, SOB or lightheadedness with hypotension: no Has patient had a PCN reaction causing severe rash involving mucus membranes or skin necrosis: no Has patient had a PCN reaction that required hospitalization: no Has patient had a PCN reaction occurring within the last 10 years: no If all of the above answers are "NO", then may proceed with Cephalosporin use.   . Sitagliptin Other (See Comments)    Family History  Problem Relation Age of Onset  . Stroke Mother   . Heart disease Maternal Grandmother   . Pneumonia Father     Prior to Admission medications   Medication Sig Start Date End Date Taking? Authorizing Provider  aspirin 81 MG tablet Take 81 mg by mouth daily.   Yes Historical Provider, MD  BAYER CONTOUR NEXT TEST test strip 1 each by Other route daily.  07/26/15  Yes Historical Provider, MD  carvedilol (COREG) 12.5 MG tablet Take 12.5 mg by mouth 2 (two) times daily with a meal. 06/26/15  Yes Historical Provider, MD  clopidogrel (PLAVIX) 75 MG tablet Take 75 mg by mouth daily.  05/28/15  Yes Historical Provider, MD  glipiZIDE (GLUCOTROL) 10 MG tablet Take 10 mg by mouth daily before breakfast.  07/14/15  Yes Historical Provider, MD  hydrochlorothiazide (HYDRODIURIL) 25 MG tablet Take 25 mg by mouth daily.  07/28/15  Yes Historical Provider, MD  hydrocortisone (ANUSOL-HC) 2.5 % rectal cream Place 1  application rectally 2 (two) times daily as needed for hemorrhoids or itching.   Yes Historical Provider, MD  metFORMIN (GLUCOPHAGE) 500 MG tablet Take 500 mg by mouth 2 (two) times daily after a meal.  06/16/15  Yes Historical Provider, MD  NIFEdipine (PROCARDIA-XL/ADALAT-CC/NIFEDICAL-XL) 30 MG 24 hr tablet Take 30 mg by mouth daily.  05/28/15  Yes Historical Provider, MD  ramipril (ALTACE) 10 MG capsule Take 10 mg by mouth 2 (two) times daily.  05/30/15  Yes Historical  Provider, MD    Physical Exam: Vitals:   04/12/16 0530 04/12/16 0600  BP: 123/72 120/66  Pulse: 118 114  Resp: 21 18  Temp:       General: Well developed, thin, NAD, appears stated age  HEENT: NCAT, PERRLA, EOMI, Anicteic Sclera, mucous membranes moist.   Neck: Supple, no JVD, no masses  Cardiovascular: S1 S2 auscultated, no rubs, murmurs or gallops. tachycardia.  Respiratory: Clear to auscultation bilaterally with equal chest rise  Abdomen: Soft, nontender, nondistended, + bowel sounds  Extremities: warm dry without cyanosis clubbing or edema. Left hip limited range of motion due to pain, TTP. Muscle atrophy noted in extremities.   Neuro: AAOx3, cranial nerves grossly intact. Limited strength testing in LLE. Otherwise, patient appears to be generally weak, strength 4/5 in upper extremities/RLE.  Skin: Without rashes exudates or nodules  Psych: Normal affect and demeanor with intact judgement and insight  Labs on Admission: I have personally reviewed following labs and imaging studies CBC:  Recent Labs Lab 04/12/16 0448  WBC 20.5*  NEUTROABS 18.6*  HGB 13.6  HCT 39.4  MCV 92.9  PLT 362   Basic Metabolic Panel:  Recent Labs Lab 04/12/16 0448  NA 136  K 3.3*  CL 95*  CO2 28  GLUCOSE 372*  BUN 17  CREATININE 0.91  CALCIUM 9.4   GFR: CrCl cannot be calculated (Unknown ideal weight.). Liver Function Tests: No results for input(s): AST, ALT, ALKPHOS, BILITOT, PROT, ALBUMIN in the last 168 hours. No results for input(s): LIPASE, AMYLASE in the last 168 hours. No results for input(s): AMMONIA in the last 168 hours. Coagulation Profile:  Recent Labs Lab 04/12/16 0448  INR 1.03   Cardiac Enzymes:  Recent Labs Lab 04/12/16 0448  CKTOTAL 123   BNP (last 3 results) No results for input(s): PROBNP in the last 8760 hours. HbA1C: No results for input(s): HGBA1C in the last 72 hours. CBG: No results for input(s): GLUCAP in the last 168 hours. Lipid  Profile: No results for input(s): CHOL, HDL, LDLCALC, TRIG, CHOLHDL, LDLDIRECT in the last 72 hours. Thyroid Function Tests: No results for input(s): TSH, T4TOTAL, FREET4, T3FREE, THYROIDAB in the last 72 hours. Anemia Panel: No results for input(s): VITAMINB12, FOLATE, FERRITIN, TIBC, IRON, RETICCTPCT in the last 72 hours. Urine analysis: No results found for: COLORURINE, APPEARANCEUR, LABSPEC, PHURINE, GLUCOSEU, HGBUR, BILIRUBINUR, KETONESUR, PROTEINUR, UROBILINOGEN, NITRITE, LEUKOCYTESUR Sepsis Labs: @LABRCNTIP (procalcitonin:4,lacticidven:4) )No results found for this or any previous visit (from the past 240 hour(s)).   Radiological Exams on Admission: Dg Chest 1 View  Result Date: 04/12/2016 CLINICAL DATA:  Preoperative evaluation, LEFT hip fracture. EXAM: CHEST 1 VIEW COMPARISON:  None. FINDINGS: Cardiomediastinal silhouette is normal. Mild calcific atherosclerosis. No pleural effusions or focal consolidations. Symmetric prominence of the bilateral anterior first ribs. Trachea projects midline and there is no pneumothorax. Soft tissue planes and included osseous structures are non-suspicious. IMPRESSION: No acute cardiopulmonary process. Electronically Signed   By: Awilda Metro M.D.   On: 04/12/2016 04:22  Ct Head Wo Contrast  Result Date: 04/12/2016 CLINICAL DATA:  Ground level fall tonight while walking to the bathroom. Anticoagulated. EXAM: CT HEAD WITHOUT CONTRAST CT CERVICAL SPINE WITHOUT CONTRAST TECHNIQUE: Multidetector CT imaging of the head and cervical spine was performed following the standard protocol without intravenous contrast. Multiplanar CT image reconstructions of the cervical spine were also generated. COMPARISON:  None. FINDINGS: CT HEAD FINDINGS Brain: There is no intracranial hemorrhage, mass or evidence of acute infarction. There is remote right MCA infarction with encephalomalacia. There is remote left internal capsule infarction. There is mild generalized atrophy.  There is patchy white matter hypodensity in both cerebral hemispheres, likely chronic small vessel ischemic disease. Vascular: No hyperdense vessel or unexpected calcification. Skull: Normal. Negative for fracture or focal lesion. Sinuses/Orbits: No acute finding. Other: None. CT CERVICAL SPINE FINDINGS Alignment: Normal. Skull base and vertebrae: No acute fracture. No primary bone lesion or focal pathologic process. Soft tissues and spinal canal: No prevertebral fluid or swelling. No visible canal hematoma. Disc levels: There is severe multilevel degenerative central canal stenosis, greatest at C4-5 and C5-6. Facet articulations are intact and well preserved. Upper chest: Negative. Other: None IMPRESSION: 1. Remote right MCA infarction. Remote left internal capsule infarction. Mild generalized atrophy and moderate chronic white matter hypodensity consistent small vessel ischemic disease. No acute intracranial findings. 2. Negative for acute cervical spine fracture appear 3. 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 Electronically Signed   By: Ellery Plunkaniel R Mitchell M.D.   On: 04/12/2016 04:40   Ct Cervical Spine Wo Contrast  Result Date: 04/12/2016 CLINICAL DATA:  Ground level fall tonight while walking to the bathroom. Anticoagulated. EXAM: CT HEAD WITHOUT CONTRAST CT CERVICAL SPINE WITHOUT CONTRAST TECHNIQUE: Multidetector CT imaging of the head and cervical spine was performed following the standard protocol without intravenous contrast. Multiplanar CT image reconstructions of the cervical spine were also generated. COMPARISON:  None. FINDINGS: CT HEAD FINDINGS Brain: There is no intracranial hemorrhage, mass or evidence of acute infarction. There is remote right MCA infarction with encephalomalacia. There is remote left internal capsule infarction. There is mild generalized atrophy. There is patchy white matter hypodensity in both cerebral hemispheres, likely  chronic small vessel ischemic disease. Vascular: No hyperdense vessel or unexpected calcification. Skull: Normal. Negative for fracture or focal lesion. Sinuses/Orbits: No acute finding. Other: None. CT CERVICAL SPINE FINDINGS Alignment: Normal. Skull base and vertebrae: No acute fracture. No primary bone lesion or focal pathologic process. Soft tissues and spinal canal: No prevertebral fluid or swelling. No visible canal hematoma. Disc levels: There is severe multilevel degenerative central canal stenosis, greatest at C4-5 and C5-6. Facet articulations are intact and well preserved. Upper chest: Negative. Other: None IMPRESSION: 1. Remote right MCA infarction. Remote left internal capsule infarction. Mild generalized atrophy and moderate chronic white matter hypodensity consistent small vessel ischemic disease. No acute intracranial findings. 2. Negative for acute cervical spine fracture appear 3. 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 Electronically Signed   By: Ellery Plunkaniel R Mitchell M.D.   On: 04/12/2016 04:40   Dg Hip Unilat With Pelvis 2-3 Views Left  Result Date: 04/12/2016 CLINICAL DATA:  Larey SeatFell walking to bathroom. LEFT hip pain and deformity. EXAM: DG HIP (WITH OR WITHOUT PELVIS) 2-3V LEFT COMPARISON:  None. FINDINGS: Acute comminuted LEFT femur intertrochanteric fracture with impaction, varus angulation distal bony fragments. No dislocation. Mild bilateral hip osteoarthrosis. Osteopenia without destructive bony lesions. Plastic radiopaque  foreign body projects in LEFT abdomen, seen on single view. Moderate vascular calcifications. IMPRESSION: Acute displaced LEFT femur intertrochanteric fracture. No dislocation. Plastic radiopaque foreign body projects in LEFT mid abdomen. Electronically Signed   By: Awilda Metro M.D.   On: 04/12/2016 04:20    EKG: Independently reviewed. Sinus tachycardia, rate 133, PVC, right bundle branch block  (old)  Assessment/Plan  Closed left hip fracture -secondary to mechanical fall (patient has been falling quite a bit lately- per family) -X-ray shows acute displaced left femur intertrochanteric fracture -CT head showed remote right MCA and fraction, left internal capsule infarction -CT cervical spine showed no acute fractures -Orthopedics consultation appreciated, plan for OR today -hip fracture order set utilized -Given patient's age and medical problems, uncontrolled diabetes, CVA, patient is moderate to high risk for surgery -Last Plavix and aspirin doses taken yesterday 04/11/2016 -Continue pain control -PT/OT will likely be consulted 04/13/2016. Patient may benefit from SNF  Leukocytosis -Likely secondary to closed hip fracture, reactive -Continue to monitor CBC -Patient currently afebrile, chest x-ray unremarkable for infection -Patient has no urinary complaints  Uncontrolled diabetes mellitus, type II -Patient does follow with endocrinology, Dr. Elvera Lennox -Last hemoglobin A1c 14 2017 was 10 -Hold medications glipizide, metformin -Placed on insulin sliding scale CBG monitoring -Patient currently not on insulin as he would not be compliant (per family)  History of CVA -Plavix and aspirin held -not sure why patient is not on a statin  Essential hypertension -Continue Coreg, HCTZ, nifedipine, ramipril  Hypokalemia -Likely secondary to diuretics, will replace and continue to monitor BMP  DVT prophylaxis: SCDs  Code Status: Full  Family Communication: Family at bedside. Admission, patients condition and plan of care including tests being ordered have been discussed with the patient and family who indicate understanding and agree with the plan and Code Status.  Disposition Plan: Likely SNF   Consults called: Orthopedics, Dr. Magnus Ivan   Admission status: Inpatient    Time spent: 70 minutes  Anahy Esh D.O. [facility] Pager [phone]  If 7PM-7AM,  please contact night-coverage [email or web address] Password [email or web address] 04/12/2016, 8:10 AM

## 2016-04-12 NOTE — Consult Note (Signed)
Reason for Consult: Left hip fracture Referring Physician: Kathrynn Humble, MD (EDP)  Charles Welch is an 71 y.o. male.  HPI:   71 yo frail male with a history of mechanical falls.  Sustained a fall early this am and was brought to the ED with left hip pain and the inability to ambulate.  He denies other injuries other than left hip pain and reports this to be an accidental fall.  In the ED he was found to have a left hip intertrochanteric fracture (I have independently reviewed the films and agree).  Ortho and [facility] are consulted.  His family is at the bedside.  His pain is significant with that left hip with attempts at motion.  Past Medical History:  Diagnosis Date  . CVA (cerebral vascular accident) ([place]) 2000   Left side, Carotid doppler in Sept 2011 showed less thank 50% obstruction  . Diabetes mellitus without complication ([place])   . Hyperlipidemia   . Hypertension   . Nontoxic multinodular goiter   . Nontoxic multinodular goiter   . RBBB   . RBBB   . Stroke ([place])    2000, L hemiparesis    Past Surgical History:  Procedure Laterality Date  . SPINE SURGERY     L3, L4  . TONSILLECTOMY      Family History  Problem Relation Age of Onset  . Stroke Mother   . Heart disease Maternal Grandmother   . Pneumonia Father     Social History:  reports that he has never smoked. He has never used smokeless tobacco. He reports that he does not drink alcohol or use drugs.  Allergies:  Allergies  Allergen Reactions  . Canagliflozin Swelling  . Clarithromycin Other (See Comments)  . Hydrochlorothiazide Other (See Comments)  . Penicillin G Itching    Has patient had a PCN reaction causing immediate rash, facial/tongue/throat swelling, SOB or lightheadedness with hypotension: no Has patient had a PCN reaction causing severe rash involving mucus membranes or skin necrosis: no Has patient had a PCN reaction that required hospitalization: no Has patient had a PCN reaction occurring  within the last 10 years: no If all of the above answers are "NO", then may proceed with Cephalosporin use.   . Sitagliptin Other (See Comments)    Medications: I have reviewed the patient's current medications.  Results for orders placed or performed during the hospital encounter of 04/12/16 (from the past 48 hour(s))  Basic metabolic panel     Status: Abnormal   Collection Time: 04/12/16  4:48 AM  Result Value Ref Range   Sodium 136 135 - 145 mmol/L   Potassium 3.3 (L) 3.5 - 5.1 mmol/L   Chloride 95 (L) 101 - 111 mmol/L   CO2 28 22 - 32 mmol/L   Glucose, Bld 372 (H) 65 - 99 mg/dL   BUN 17 6 - 20 mg/dL   Creatinine, Ser 0.91 0.61 - 1.24 mg/dL   Calcium 9.4 8.9 - 10.3 mg/dL   GFR calc non Af Amer >60 >60 mL/min   GFR calc Af Amer >60 >60 mL/min    Comment: (NOTE) The eGFR has been calculated using the CKD EPI equation. This calculation has not been validated in all clinical situations. eGFR's persistently <60 mL/min signify possible Chronic Kidney Disease.    Anion gap 13 5 - 15  CBC WITH DIFFERENTIAL     Status: Abnormal   Collection Time: 04/12/16  4:48 AM  Result Value Ref Range   WBC 20.5 (H)  4.0 - 10.5 K/uL   RBC 4.24 4.22 - 5.81 MIL/uL   Hemoglobin 13.6 13.0 - 17.0 g/dL   HCT 39.4 39.0 - 52.0 %   MCV 92.9 78.0 - 100.0 fL   MCH 32.1 26.0 - 34.0 pg   MCHC 34.5 30.0 - 36.0 g/dL   RDW 12.7 11.5 - 15.5 %   Platelets 362 150 - 400 K/uL   Neutrophils Relative % 91 %   Neutro Abs 18.6 (H) 1.7 - 7.7 K/uL   Lymphocytes Relative 5 %   Lymphs Abs 1.1 0.7 - 4.0 K/uL   Monocytes Relative 4 %   Monocytes Absolute 0.8 0.1 - 1.0 K/uL   Eosinophils Relative 0 %   Eosinophils Absolute 0.0 0.0 - 0.7 K/uL   Basophils Relative 0 %   Basophils Absolute 0.0 0.0 - 0.1 K/uL  Protime-INR     Status: None   Collection Time: 04/12/16  4:48 AM  Result Value Ref Range   Prothrombin Time 13.6 11.4 - 15.2 seconds   INR 1.03   Type and screen [place]     Status:  None   Collection Time: 04/12/16  4:48 AM  Result Value Ref Range   ABO/RH(D) A POS    Antibody Screen NEG    Sample Expiration 04/15/2016   CK     Status: None   Collection Time: 04/12/16  4:48 AM  Result Value Ref Range   Total CK 123 49 - 397 U/L    Dg Chest 1 View  Result Date: 04/12/2016 CLINICAL DATA:  Preoperative evaluation, LEFT hip fracture. EXAM: CHEST 1 VIEW COMPARISON:  None. FINDINGS: Cardiomediastinal silhouette is normal. Mild calcific atherosclerosis. No pleural effusions or focal consolidations. Symmetric prominence of the bilateral anterior first ribs. Trachea projects midline and there is no pneumothorax. Soft tissue planes and included osseous structures are non-suspicious. IMPRESSION: No acute cardiopulmonary process. Electronically Signed   By: Elon Alas M.D.   On: 04/12/2016 04:22   Ct Head Wo Contrast  Result Date: 04/12/2016 CLINICAL DATA:  Ground level fall tonight while walking to the bathroom. Anticoagulated. EXAM: CT HEAD WITHOUT CONTRAST CT CERVICAL SPINE WITHOUT CONTRAST TECHNIQUE: Multidetector CT imaging of the head and cervical spine was performed following the standard protocol without intravenous contrast. Multiplanar CT image reconstructions of the cervical spine were also generated. COMPARISON:  None. FINDINGS: CT HEAD FINDINGS Brain: There is no intracranial hemorrhage, mass or evidence of acute infarction. There is remote right MCA infarction with encephalomalacia. There is remote left internal capsule infarction. There is mild generalized atrophy. There is patchy white matter hypodensity in both cerebral hemispheres, likely chronic small vessel ischemic disease. Vascular: No hyperdense vessel or unexpected calcification. Skull: Normal. Negative for fracture or focal lesion. Sinuses/Orbits: No acute finding. Other: None. CT CERVICAL SPINE FINDINGS Alignment: Normal. Skull base and vertebrae: No acute fracture. No primary bone lesion or focal pathologic  process. Soft tissues and spinal canal: No prevertebral fluid or swelling. No visible canal hematoma. Disc levels: There is severe multilevel degenerative central canal stenosis, greatest at C4-5 and C5-6. Facet articulations are intact and well preserved. Upper chest: Negative. Other: None IMPRESSION: 1. Remote right MCA infarction. Remote left internal capsule infarction. Mild generalized atrophy and moderate chronic white matter hypodensity consistent small vessel ischemic disease. No acute intracranial findings. 2. Negative for acute cervical spine fracture appear 3. 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 Electronically Signed   By: Quillian Quince  Armandina Stammer M.D.   On: 04/12/2016 04:40   Ct Cervical Spine Wo Contrast  Result Date: 04/12/2016 CLINICAL DATA:  Ground level fall tonight while walking to the bathroom. Anticoagulated. EXAM: CT HEAD WITHOUT CONTRAST CT CERVICAL SPINE WITHOUT CONTRAST TECHNIQUE: Multidetector CT imaging of the head and cervical spine was performed following the standard protocol without intravenous contrast. Multiplanar CT image reconstructions of the cervical spine were also generated. COMPARISON:  None. FINDINGS: CT HEAD FINDINGS Brain: There is no intracranial hemorrhage, mass or evidence of acute infarction. There is remote right MCA infarction with encephalomalacia. There is remote left internal capsule infarction. There is mild generalized atrophy. There is patchy white matter hypodensity in both cerebral hemispheres, likely chronic small vessel ischemic disease. Vascular: No hyperdense vessel or unexpected calcification. Skull: Normal. Negative for fracture or focal lesion. Sinuses/Orbits: No acute finding. Other: None. CT CERVICAL SPINE FINDINGS Alignment: Normal. Skull base and vertebrae: No acute fracture. No primary bone lesion or focal pathologic process. Soft tissues and spinal canal: No prevertebral fluid or swelling. No  visible canal hematoma. Disc levels: There is severe multilevel degenerative central canal stenosis, greatest at C4-5 and C5-6. Facet articulations are intact and well preserved. Upper chest: Negative. Other: None IMPRESSION: 1. Remote right MCA infarction. Remote left internal capsule infarction. Mild generalized atrophy and moderate chronic white matter hypodensity consistent small vessel ischemic disease. No acute intracranial findings. 2. Negative for acute cervical spine fracture appear 3. 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 Electronically Signed   By: Andreas Newport M.D.   On: 04/12/2016 04:40   Dg Hip Unilat With Pelvis 2-3 Views Left  Result Date: 04/12/2016 CLINICAL DATA:  [place] [address] walking to bathroom. LEFT hip pain and deformity. EXAM: DG HIP (WITH OR WITHOUT PELVIS) 2-3V LEFT COMPARISON:  None. FINDINGS: Acute comminuted LEFT femur intertrochanteric fracture with impaction, varus angulation distal bony fragments. No dislocation. Mild bilateral hip osteoarthrosis. Osteopenia without destructive bony lesions. Plastic radiopaque foreign body projects in LEFT abdomen, seen on single view. Moderate vascular calcifications. IMPRESSION: Acute displaced LEFT femur intertrochanteric fracture. No dislocation. Plastic radiopaque foreign body projects in LEFT mid abdomen. Electronically Signed   By: Elon Alas M.D.   On: 04/12/2016 04:20    ROS Blood pressure 120/66, pulse 114, temperature 98.1 F (36.7 C), temperature source Oral, resp. rate 18, SpO2 98 %. Physical Exam  Constitutional: He is oriented to person, place, and time. He appears well-developed and well-nourished.  HENT:  Head: Normocephalic and atraumatic.  Cardiovascular: Tachycardia present.   Respiratory: Effort normal.  GI: Soft.  Musculoskeletal:       Left hip: He exhibits decreased range of motion, decreased strength, tenderness and bony tenderness.  Neurological: He is  alert and oriented to person, place, and time.  Psychiatric: He has a normal mood and affect.    Assessment/Plan: Left displaced intertrochanteric hip fracture 1)  I have spoken to him and his family at length and have recommended surgery.  Reasoning behind surgery was discussed. Risks and benefits were discussed in detail.  Surgery will be this am pending medical clearance.    Mcarthur Rossetti 04/12/2016, 7:40 AM

## 2016-04-12 NOTE — Anesthesia Postprocedure Evaluation (Signed)
Anesthesia Post Note  Patient: Deirdre PeerRonald D Gwyn  Procedure(s) Performed: Procedure(s) (LRB): INTRAMEDULLARY (IM) NAIL INTERTROCHANTRIC (Left)  Anesthesia Post Evaluation     Last Vitals:  Vitals:   04/12/16 1300 04/12/16 1315  BP: (!) 104/54 118/64  Pulse: (!) 104 (!) 106  Resp: 13 15  Temp:  36.7 C    Last Pain:  Vitals:   04/12/16 1315  TempSrc:   PainSc: 4                  Stachia Slutsky

## 2016-04-12 NOTE — ED Notes (Signed)
Patient transported to X-ray 

## 2016-04-12 NOTE — Anesthesia Postprocedure Evaluation (Signed)
Anesthesia Post Note  Patient: Deirdre PeerRonald D Schlesinger  Procedure(s) Performed: Procedure(s) (LRB): INTRAMEDULLARY (IM) NAIL INTERTROCHANTRIC (Left)  Patient location during evaluation: PACU Anesthesia Type: General Level of consciousness: awake Pain management: pain level controlled Vital Signs Assessment: post-procedure vital signs reviewed and stable Respiratory status: spontaneous breathing, nonlabored ventilation, respiratory function stable and patient connected to nasal cannula oxygen Cardiovascular status: blood pressure returned to baseline and stable Postop Assessment: no signs of nausea or vomiting Anesthetic complications: no       Last Vitals:  Vitals:   04/12/16 1330 04/12/16 1430  BP: 125/60 (!) 85/67  Pulse: (!) 112 (!) 109  Resp: 16 18  Temp: 36.8 C 36.9 C    Last Pain:  Vitals:   04/12/16 1430  TempSrc: Oral  PainSc:                  Olena Willy

## 2016-04-12 NOTE — ED Notes (Signed)
To O.R. At this time. He remains in no distress.

## 2016-04-12 NOTE — Anesthesia Preprocedure Evaluation (Signed)
Anesthesia Evaluation  Patient identified by MRN, date of birth, ID band Patient awake    Reviewed: Allergy & Precautions, NPO status , Patient's Chart, lab work & pertinent test results  History of Anesthesia Complications Negative for: history of anesthetic complications  Airway Mallampati: I  TM Distance: >3 FB Neck ROM: Full    Dental  (+) Missing, Chipped   Pulmonary neg pulmonary ROS,    breath sounds clear to auscultation       Cardiovascular hypertension, Pt. on medications and Pt. on home beta blockers (-) angina+ Peripheral Vascular Disease  + dysrhythmias  Rhythm:Regular Rate:Tachycardia     Neuro/Psych Left sided weakness and contracture CVA, Residual Symptoms negative psych ROS   GI/Hepatic negative GI ROS, Neg liver ROS,   Endo/Other  diabetes  Renal/GU      Musculoskeletal   Abdominal   Peds  Hematology   Anesthesia Other Findings   Reproductive/Obstetrics                             Anesthesia Physical Anesthesia Plan  ASA: III  Anesthesia Plan: General   Post-op Pain Management:    Induction: Intravenous  Airway Management Planned: Oral ETT  Additional Equipment: None  Intra-op Plan:   Post-operative Plan: Extubation in OR  Informed Consent: I have reviewed the patients History and Physical, chart, labs and discussed the procedure including the risks, benefits and alternatives for the proposed anesthesia with the patient or authorized representative who has indicated his/her understanding and acceptance.   Dental advisory given  Plan Discussed with: CRNA and Surgeon  Anesthesia Plan Comments:         Anesthesia Quick Evaluation

## 2016-04-12 NOTE — Op Note (Signed)
Charles Welch, Charles Welch               ACCOUNT NO.:  [id number]  MEDICAL RECORD NO.:  [id number]  LOCATION:  RESA                         FACILITY[facility]  [facility]  PHYSICIAN:  Charles Welch, Charles WelchDATE OF BIRTH:  1945/05/09  DATE OF PROCEDURE:  04/12/2016 DATE OF DISCHARGE:                              OPERATIVE REPORT   PREOPERATIVE DIAGNOSIS:  Left comminuted intertrochanteric hip/femur fracture.  POSTOPERATIVE DIAGNOSIS:  Left comminuted intertrochanteric hip/femur fracture.  PROCEDURE:  Open reduction and internal fixation of left comminuted intertrochanteric femur fracture using an intramedullary rod and hip screw.  IMPLANTS:  Biomet 10 x 400 femoral nail with a 100 mm lag compression screw.  SURGEON:  Charles Welch, M.D.  ASSISTANT:  Charles Canal, Charles Welch.  ANESTHESIA:  General.  ANTIBIOTICS:  2 g of IV Ancef.  ESTIMATED BLOOD LOSS:  100 to 200 mL.  COMPLICATIONS:  None.  INDICATIONS:  Charles Welch is a 71 year old gentleman who is actually much older physiologically than his stated age.  He has multiple medical problems and sustained a mechanical fall earlier this morning.  He was seen in the [facility] Emergency Room and found to have intertrochanteric hip fracture of the left hip and it was quite comminuted.  I talked to him and his family about the need and recommendations for surgery.  He was seen by the Medical Service as well, and we are proceeding to the operating room at this point.  A thorough discussion of risks and benefits was then had with him as well.  PROCEDURE DESCRIPTION:  After informed consent was obtained, appropriate left hip was marked.  He was brought to the operating room, where general anesthesia was obtained while he was on the stretcher.  A Foley catheter was placed.  Next, he was placed supine on the fracture table with his left operative leg in in-line skeletal traction with the perineal post in place and the right hip flexed  and abducted out of the field with appropriate padding and a stirrup.  We then assessed the hip under direct fluoroscopy and found the greater trochanteric area to be quite comminuted.  We were able to get it in this reduced position as possible.  We then prepped his right hip with DuraPrep and sterile drapes.  Time-out was called.  He was identified as correct patient and correct right hip.  We then made an incision over the greater trochanter and carried this proximally and distally.  I dissected down the tip of greater trochanter and placed temporary guide pin in antegrade fashion. I then used an initiating reamer to open the femoral canal.  This was all utilizing direct fluoroscopy.  We had already pre-chosen our nail as a 10 x 400 nail.  We placed this easily down the femoral canal without difficulty.  We then made a separate lateral incision using the outrigger guide, drilled, and due to the soft bone, I wanted this hip screw to be in an inferior and posterior position due to his good bone quality because his bone was soft.  I was able to place 100 mm lag compression screw then without difficulty.  We then removed all instrumentation and irrigated the wounds with normal saline  solution. We then closed the deep tissue with 0-Vicryl followed by 2-0 Vicryl in the subcutaneous tissue and interrupted staples on the skin.  Well- padded sterile dressing was applied.  He was taken off the fracture table, awakened, extubated, and taken to the recovery room in stable condition.  All final counts were correct.  There were no complications noted.  Of note, Charles CanalGilbert Clark, Charles Welch assisted in the entire case.  His assistance was crucial for facilitating all aspects of this case.     Charles Welch, M.D.     CYB/MEDQ  D:  04/12/2016  T:  04/12/2016  Job:  [id number]

## 2016-04-12 NOTE — ED Triage Notes (Signed)
Pt bib PTAR from home.  Per PTAR pt reports walking to the bathroom when he fell.  Pt states it occurred at approximately 0030.  Denies hitting head or LOC.  PTAR reports left leg shortening and rotation.  Pt rates pain 9/10.

## 2016-04-12 NOTE — ED Notes (Signed)
The hospitalist is seeing him as I write this.

## 2016-04-12 NOTE — Anesthesia Procedure Notes (Signed)

## 2016-04-12 NOTE — Brief Op Note (Signed)
04/12/2016  11:58 AM  PATIENT:  Deirdre Peeronald D Margolis  71 y.o. male  PRE-OPERATIVE DIAGNOSIS:  Left hip fracture  POST-OPERATIVE DIAGNOSIS:  Left hip fracture  PROCEDURE:  Procedure(s): INTRAMEDULLARY (IM) NAIL INTERTROCHANTRIC (Left)  SURGEON:  Surgeon(s) and Role:    * Kathryne Hitchhristopher Y Blackman, MD - Primary  PHYSICIAN ASSISTANT: Rexene EdisonGil Clark, PA-C  ANESTHESIA:   general  EBL:100-200 cc  COUNTS:  YES  TOURNIQUET:  * No tourniquets in log *  DICTATION: .Other Dictation: Dictation Number [id number]  PLAN OF CARE: Admit to inpatient   PATIENT DISPOSITION:  PACU - hemodynamically stable.   Delay start of Pharmacological VTE agent (>24hrs) due to surgical blood loss or risk of bleeding: no

## 2016-04-12 NOTE — ED Notes (Signed)
Dr. Magnus IvanBlackman has just seen the pt. And has filled out the O.R. Permit.

## 2016-04-12 NOTE — ED Notes (Signed)
Bed: RESA Expected date:  Expected time:  Means of arrival:  Comments: EMS fall/hip deformity

## 2016-04-12 NOTE — Anesthesia Procedure Notes (Deleted)
Performed by: Donella Pascarella M       

## 2016-04-12 NOTE — Transfer of Care (Signed)
Immediate Anesthesia Transfer of Care Note  Patient: Deirdre PeerRonald D Sanagustin  Procedure(s) Performed: Procedure(s): INTRAMEDULLARY (IM) NAIL INTERTROCHANTRIC (Left)  Patient Location: PACU  Anesthesia Type:General  Level of Consciousness:  sedated, patient cooperative and responds to stimulation  Airway & Oxygen Therapy:Patient Spontanous Breathing and Patient connected to face mask oxgen  Post-op Assessment:  Report given to PACU RN and Post -op Vital signs reviewed and stable  Post vital signs:  Reviewed and stable  Last Vitals:  Vitals:   04/12/16 0845 04/12/16 0846  BP:  128/68  Pulse:  (!) 129  Resp: 22 18  Temp:      Complications: No apparent anesthesia complications

## 2016-04-13 ENCOUNTER — Encounter (HOSPITAL_COMMUNITY): Payer: Self-pay | Admitting: Orthopaedic Surgery

## 2016-04-13 DIAGNOSIS — Z8673 Personal history of transient ischemic attack (TIA), and cerebral infarction without residual deficits: Secondary | ICD-10-CM

## 2016-04-13 LAB — CBC
HCT: 29.3 % — ABNORMAL LOW (ref 39.0–52.0)
Hemoglobin: 10.2 g/dL — ABNORMAL LOW (ref 13.0–17.0)
MCH: 32.6 pg (ref 26.0–34.0)
MCHC: 34.8 g/dL (ref 30.0–36.0)
MCV: 93.6 fL (ref 78.0–100.0)
PLATELETS: 267 10*3/uL (ref 150–400)
RBC: 3.13 MIL/uL — ABNORMAL LOW (ref 4.22–5.81)
RDW: 13.1 % (ref 11.5–15.5)
WBC: 15.6 10*3/uL — ABNORMAL HIGH (ref 4.0–10.5)

## 2016-04-13 LAB — BASIC METABOLIC PANEL
Anion gap: 9 (ref 5–15)
BUN: 23 mg/dL — AB (ref 6–20)
CHLORIDE: 100 mmol/L — AB (ref 101–111)
CO2: 28 mmol/L (ref 22–32)
CREATININE: 1.07 mg/dL (ref 0.61–1.24)
Calcium: 8.5 mg/dL — ABNORMAL LOW (ref 8.9–10.3)
GFR calc Af Amer: >60 mL/min (ref >60)
Glucose, Bld: 166 mg/dL — ABNORMAL HIGH (ref 65–99)
Potassium: 3.2 mmol/L — ABNORMAL LOW (ref 3.5–5.1)
SODIUM: 137 mmol/L (ref 135–145)

## 2016-04-13 LAB — VITAMIN B12: Vitamin B-12: 753 pg/mL (ref 180–914)

## 2016-04-13 LAB — GLUCOSE, CAPILLARY
GLUCOSE-CAPILLARY: 181 mg/dL — AB (ref 65–99)
Glucose-Capillary: 147 mg/dL — ABNORMAL HIGH (ref 65–99)
Glucose-Capillary: 141 mg/dL — ABNORMAL HIGH (ref 65–99)
Glucose-Capillary: 239 mg/dL — ABNORMAL HIGH (ref 65–99)
Glucose-Capillary: 246 mg/dL — ABNORMAL HIGH (ref 65–99)
Glucose-Capillary: 218 mg/dL — ABNORMAL HIGH (ref 65–99)

## 2016-04-13 LAB — IRON AND TIBC
IRON: 25 ug/dL — AB (ref 45–182)
SATURATION RATIOS: 14 % — AB (ref 17.9–39.5)
TIBC: 174 ug/dL — ABNORMAL LOW (ref 250–450)
UIBC: 149 ug/dL

## 2016-04-13 LAB — FOLATE: Folate: 26.7 ng/mL

## 2016-04-13 LAB — FERRITIN: Ferritin: 430 ng/mL — ABNORMAL HIGH (ref 24–336)

## 2016-04-13 LAB — MAGNESIUM: Magnesium: 1.6 mg/dL — ABNORMAL LOW (ref 1.7–2.4)

## 2016-04-13 MED ORDER — ASPIRIN 325 MG PO TABS
325.0000 mg | ORAL_TABLET | Freq: Two times a day (BID) | ORAL | Status: DC
  Administered 2016-04-13 – 2016-04-15 (×4): 325 mg via ORAL
  Filled 2016-04-13 (×4): qty 1

## 2016-04-13 MED ORDER — MAGNESIUM SULFATE 4 GM/100ML IV SOLN
4.0000 g | Freq: Once | INTRAVENOUS | Status: AC
  Administered 2016-04-13: 4 g via INTRAVENOUS
  Filled 2016-04-13: qty 100

## 2016-04-13 MED ORDER — INSULIN GLARGINE 100 UNIT/ML ~~LOC~~ SOLN
10.0000 [IU] | Freq: Every day | SUBCUTANEOUS | Status: DC
  Administered 2016-04-13 – 2016-04-14 (×2): 10 [IU] via SUBCUTANEOUS
  Filled 2016-04-13 (×3): qty 0.1

## 2016-04-13 MED ORDER — POTASSIUM CHLORIDE CRYS ER 20 MEQ PO TBCR
40.0000 meq | EXTENDED_RELEASE_TABLET | ORAL | Status: AC
  Administered 2016-04-13 (×2): 40 meq via ORAL
  Filled 2016-04-13 (×2): qty 2

## 2016-04-13 NOTE — Progress Notes (Signed)
PROGRESS NOTE    Charles Welch  ZOX:[id number] DOB: 1946-03-23 DOA: 04/12/2016 PCP: [name], MD    Brief Narrative:  Patient is a 71 year old gentleman history of prior stroke, diabetes, hypertension presented to the ED after a fall with left leg pain x-rays showing left hip fracture. Orthopedics were consulted patient underwent ORIF 04/12/2016.   Assessment & Plan:   Principal Problem:   Closed fracture of femur, intertrochanteric, left, initial encounter (HCC) Active Problems:   Poorly controlled type 2 diabetes mellitus with circulatory disorder (HCC)   Hip fracture (HCC)   Essential hypertension   H/O: CVA (cerebrovascular accident)  #1 closed left comminuted intertrochanteric hip/femur fracture Secondary to mechanical fall. Status post ORIF per Dr. Magnus Ivan orthopedics. PT/OT. Likely need skilled nursing facility.  #2 leukocytosis Likely reactive leukocytosis secondary to problem #1. Patient afebrile. Chest x-ray unremarkable. WBC trending down. Follow.  #3 acute blood loss anemia/iron deficiency anemia Hemoglobin currently at 10.2 from 13.6 on admission. Patient with no overt bleeding. Follow H&H. May benefit from oral iron supplementation on discharge.  #4 hypertension Blood pressure has been borderline. Will discontinue HCTZ and ACE inhibitor. Continue Coreg and Procardia.  #5 history of CVA Stable. Plavix on hold. Continue aspirin for secondary stroke prevention. Check a fasting lipid panel.  #6 diabetes mellitus type 2 Check a hemoglobin A1c. CBGs have ranged from 141 -246. Continue to hold oral hypoglycemic agents. As noted per admitting physician that patient was not on insulin prior to admission secondary to compliance. Place on Lantus 10 units daily. Continue sliding scale insulin.  #7 hypokalemia Likely secondary to diuretics. Replete.    DVT prophylaxis: Full dose aspirin 325 mg twice a day per orthopedics Code Status: Full Family  Communication: Updated patient. No family present. Disposition Plan: Likely skilled nursing facility after evaluation by PT and per orthopedics.   Consultants:   Orthopedics: Dr. Magnus Ivan 04/12/2016  Procedures:   CT head CT C-spine 04/12/2016  Chest x-ray 04/12/2016  X-ray of the left hip and pelvis 04/12/2016  X-ray of the pelvis 04/12/2016  X-ray of the left femur 04/12/2016  ORIF left comminuted intertrochanteric femur fracture using IM rod and hip screw and Dr. Magnus Ivan 04/12/2016  Antimicrobials:   None   Subjective: Patient sitting up in bed. No chest pain. No shortness of breath. States some significant pain while working with physical therapy and was only able to dangle his legs off the bed.  Objective: Vitals:   04/12/16 2100 04/13/16 0217 04/13/16 0501 04/13/16 1210  BP: (!) 93/52 (!) 104/48 (!) 97/49 (!) 126/53  Pulse:  98 (!) 102 (!) 111  Resp: 16 18 16    Temp:  98.3 F (36.8 C) 97.7 F (36.5 C)   TempSrc:  Oral Oral   SpO2:  98% 98%   Weight:        Intake/Output Summary (Last 24 hours) at 04/13/16 1233 Last data filed at 04/13/16 1059  Gross per 24 hour  Intake             2245 ml  Output              750 ml  Net             1495 ml   Filed Weights   04/12/16 1330  Weight: 77.6 kg (171 lb)    Examination:  General exam: Appears calm and comfortable  Respiratory system: Clear to auscultation Anterior lung fields. Respiratory effort normal. Cardiovascular system: S1 & S2 heard, RRR. No  JVD, murmurs, rubs, gallops or clicks. No pedal edema. Gastrointestinal system: Abdomen is nondistended, soft and nontender. No organomegaly or masses felt. Normal bowel sounds heard. Central nervous system: Alert and oriented. No focal neurological deficits. Extremities: Symmetric 5 x 5 power. Skin: No rashes, lesions or ulcers Psychiatry: Judgement and insight appear normal. Mood & affect appropriate.     Data Reviewed: I have personally reviewed  following labs and imaging studies  CBC:  Recent Labs Lab 04/12/16 0448 04/13/16 0428  WBC 20.5* 15.6*  NEUTROABS 18.6*  --   HGB 13.6 10.2*  HCT 39.4 29.3*  MCV 92.9 93.6  PLT 362 267   Basic Metabolic Panel:  Recent Labs Lab 04/12/16 0448 04/13/16 0428  NA 136 137  K 3.3* 3.2*  CL 95* 100*  CO2 28 28  GLUCOSE 372* 166*  BUN 17 23*  CREATININE 0.91 1.07  CALCIUM 9.4 8.5*  MG  --  1.6*   GFR: CrCl cannot be calculated (Unknown ideal weight.). Liver Function Tests: No results for input(s): AST, ALT, ALKPHOS, BILITOT, PROT, ALBUMIN in the last 168 hours. No results for input(s): LIPASE, AMYLASE in the last 168 hours. No results for input(s): AMMONIA in the last 168 hours. Coagulation Profile:  Recent Labs Lab 04/12/16 0448  INR 1.03   Cardiac Enzymes:  Recent Labs Lab 04/12/16 0448  CKTOTAL 123   BNP (last 3 results) No results for input(s): PROBNP in the last 8760 hours. HbA1C: No results for input(s): HGBA1C in the last 72 hours. CBG:  Recent Labs Lab 04/12/16 1957 04/12/16 2330 04/13/16 0400 04/13/16 0800 04/13/16 1205  GLUCAP 236* 208* 147* 141* 239*   Lipid Profile: No results for input(s): CHOL, HDL, LDLCALC, TRIG, CHOLHDL, LDLDIRECT in the last 72 hours. Thyroid Function Tests: No results for input(s): TSH, T4TOTAL, FREET4, T3FREE, THYROIDAB in the last 72 hours. Anemia Panel:  Recent Labs  04/13/16 0940  VITAMINB12 753  FERRITIN 430*  TIBC 174*  IRON 25*   Sepsis Labs: No results for input(s): PROCALCITON, LATICACIDVEN in the last 168 hours.  No results found for this or any previous visit (from the past 240 hour(s)).       Radiology Studies: Dg Chest 1 View  Result Date: 04/12/2016 CLINICAL DATA:  Preoperative evaluation, LEFT hip fracture. EXAM: CHEST 1 VIEW COMPARISON:  None. FINDINGS: Cardiomediastinal silhouette is normal. Mild calcific atherosclerosis. No pleural effusions or focal consolidations. Symmetric  prominence of the bilateral anterior first ribs. Trachea projects midline and there is no pneumothorax. Soft tissue planes and included osseous structures are non-suspicious. IMPRESSION: No acute cardiopulmonary process. Electronically Signed   By: Awilda Metroourtnay  Bloomer M.D.   On: 04/12/2016 04:22   Ct Head Wo Contrast  Result Date: 04/12/2016 CLINICAL DATA:  Ground level fall tonight while walking to the bathroom. Anticoagulated. EXAM: CT HEAD WITHOUT CONTRAST CT CERVICAL SPINE WITHOUT CONTRAST TECHNIQUE: Multidetector CT imaging of the head and cervical spine was performed following the standard protocol without intravenous contrast. Multiplanar CT image reconstructions of the cervical spine were also generated. COMPARISON:  None. FINDINGS: CT HEAD FINDINGS Brain: There is no intracranial hemorrhage, mass or evidence of acute infarction. There is remote right MCA infarction with encephalomalacia. There is remote left internal capsule infarction. There is mild generalized atrophy. There is patchy white matter hypodensity in both cerebral hemispheres, likely chronic small vessel ischemic disease. Vascular: No hyperdense vessel or unexpected calcification. Skull: Normal. Negative for fracture or focal lesion. Sinuses/Orbits: No acute finding. Other: None. CT CERVICAL  SPINE FINDINGS Alignment: Normal. Skull base and vertebrae: No acute fracture. No primary bone lesion or focal pathologic process. Soft tissues and spinal canal: No prevertebral fluid or swelling. No visible canal hematoma. Disc levels: There is severe multilevel degenerative central canal stenosis, greatest at C4-5 and C5-6. Facet articulations are intact and well preserved. Upper chest: Negative. Other: None IMPRESSION: 1. Remote right MCA infarction. Remote left internal capsule infarction. Mild generalized atrophy and moderate chronic white matter hypodensity consistent small vessel ischemic disease. No acute intracranial findings. 2. Negative for  acute cervical spine fracture appear 3. 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 Electronically Signed   By: Ellery Plunk M.D.   On: 04/12/2016 04:40   Ct Cervical Spine Wo Contrast  Result Date: 04/12/2016 CLINICAL DATA:  Ground level fall tonight while walking to the bathroom. Anticoagulated. EXAM: CT HEAD WITHOUT CONTRAST CT CERVICAL SPINE WITHOUT CONTRAST TECHNIQUE: Multidetector CT imaging of the head and cervical spine was performed following the standard protocol without intravenous contrast. Multiplanar CT image reconstructions of the cervical spine were also generated. COMPARISON:  None. FINDINGS: CT HEAD FINDINGS Brain: There is no intracranial hemorrhage, mass or evidence of acute infarction. There is remote right MCA infarction with encephalomalacia. There is remote left internal capsule infarction. There is mild generalized atrophy. There is patchy white matter hypodensity in both cerebral hemispheres, likely chronic small vessel ischemic disease. Vascular: No hyperdense vessel or unexpected calcification. Skull: Normal. Negative for fracture or focal lesion. Sinuses/Orbits: No acute finding. Other: None. CT CERVICAL SPINE FINDINGS Alignment: Normal. Skull base and vertebrae: No acute fracture. No primary bone lesion or focal pathologic process. Soft tissues and spinal canal: No prevertebral fluid or swelling. No visible canal hematoma. Disc levels: There is severe multilevel degenerative central canal stenosis, greatest at C4-5 and C5-6. Facet articulations are intact and well preserved. Upper chest: Negative. Other: None IMPRESSION: 1. Remote right MCA infarction. Remote left internal capsule infarction. Mild generalized atrophy and moderate chronic white matter hypodensity consistent small vessel ischemic disease. No acute intracranial findings. 2. Negative for acute cervical spine fracture appear 3. 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 Electronically Signed   By: Ellery Plunk M.D.   On: 04/12/2016 04:40   Pelvis Portable  Result Date: 04/12/2016 CLINICAL DATA:  Patient status post ORIF left femur fracture. EXAM: PORTABLE PELVIS 1-2 VIEWS COMPARISON:  Pelvic radiograph 04/12/2016. FINDINGS: Two views of the left femur were submitted. Patient status post intramedullary rod and screw fixation of previously visualized left femur fracture. Hardware appears in appropriate position. IMPRESSION: Patient status post ORIF left femur fracture. Electronically Signed   By: Annia Belt M.D.   On: 04/12/2016 12:50   Dg C-arm 1-60 Min-no Report  Result Date: 04/12/2016 There is no Radiologist interpretation  for this exam.  Dg Hip Unilat With Pelvis 2-3 Views Left  Result Date: 04/12/2016 CLINICAL DATA:  Larey Seat walking to bathroom. LEFT hip pain and deformity. EXAM: DG HIP (WITH OR WITHOUT PELVIS) 2-3V LEFT COMPARISON:  None. FINDINGS: Acute comminuted LEFT femur intertrochanteric fracture with impaction, varus angulation distal bony fragments. No dislocation. Mild bilateral hip osteoarthrosis. Osteopenia without destructive bony lesions. Plastic radiopaque foreign body projects in LEFT abdomen, seen on single view. Moderate vascular calcifications. IMPRESSION: Acute displaced LEFT femur intertrochanteric fracture. No dislocation. Plastic radiopaque foreign body projects in LEFT mid abdomen. Electronically Signed   By: Michel Santee.D.  On: 04/12/2016 04:20   Dg Femur Min 2 Views Left  Result Date: 04/12/2016 CLINICAL DATA:  Left IM nail. EXAM: LEFT FEMUR 2 VIEWS COMPARISON:  04/12/2016 FINDINGS: Status post placement of intramedullary nail with lag screw across intertrochanteric fracture of the left hip. Lesser trochanter fragment is again identified. Tips of the hardware are intra osseous. No new fracture identified. IMPRESSION: Status post placement of intramedullary nail with lag screw  across intertrochanteric fracture of the left hip. No complicating features identified. Electronically Signed   By: Norva Pavlov M.D.   On: 04/12/2016 12:47        Scheduled Meds: . carvedilol  12.5 mg Oral BID WC  . hydrochlorothiazide  25 mg Oral Daily  . insulin aspart  0-15 Units Subcutaneous Q4H  . lip balm   Topical Once  . magnesium sulfate 1 - 4 g bolus IVPB  4 g Intravenous Once  . NIFEdipine  30 mg Oral Daily  . potassium chloride  40 mEq Oral Q4H  . ramipril  10 mg Oral BID   Continuous Infusions: . sodium chloride 75 mL/hr at 04/13/16 0703     LOS: 1 day    Time spent: 35 minutes    Riki Berninger, MD [facility] Pager [phone]  If 7PM-7AM, please contact night-coverage [email or web address] Password [facility] 04/13/2016, 12:33 PM

## 2016-04-13 NOTE — [place] FL2 (Deleted)
[place] MEDICAID FL2 LEVEL OF CARE SCREENING TOOL     IDENTIFICATION  Patient Name: Charles Welch Birthdate: 1945-07-08 Sex: male Admission Date (Current Location): 04/12/2016  [facility] and [place] Number:  Producer, television/film/video and Address:  [facility],  [address] [place] [address]      Provider Number: [id number]  Attending Physician Name and Address:  Rodolph Bong, MD  Relative Name and Phone Number:       Current Level of Care: Hospital Recommended Level of Care: Skilled Nursing Facility Prior Approval Number:    Date Approved/Denied:   PASRR Number: [id number]  Discharge Plan: SNF    Current Diagnoses: Patient Active Problem List   Diagnosis Date Noted  . Hip fracture (HCC) 04/12/2016  . Closed fracture of femur, intertrochanteric, left, initial encounter (HCC) 04/12/2016  . Essential hypertension 04/12/2016  . H/O: CVA (cerebrovascular accident) 04/12/2016  . Poorly controlled type 2 diabetes mellitus with circulatory disorder (HCC) 07/29/2015    Orientation RESPIRATION BLADDER Height & Weight     Self, Time, Situation, Place  Normal Indwelling catheter Weight: 171 lb (77.6 kg) Height:     BEHAVIORAL SYMPTOMS/MOOD NEUROLOGICAL BOWEL NUTRITION STATUS  Other (Comment) (no behaviors)   Continent Diet  AMBULATORY STATUS COMMUNICATION OF NEEDS Skin   Extensive Assist Verbally Surgical wounds                       Personal Care Assistance Level of Assistance  Bathing, Feeding, Dressing Bathing Assistance: Maximum assistance Feeding assistance: Independent Dressing Assistance: Maximum assistance     Functional Limitations Info  Sight, Hearing, Speech Sight Info: Adequate Hearing Info: Adequate Speech Info: Adequate    SPECIAL CARE FACTORS FREQUENCY  PT (By licensed PT), OT (By licensed OT)     PT Frequency: 5x wk OT Frequency: 5x wk            Contractures Contractures Info: Not present    Additional Factors  Info  Code Status Code Status Info: Full Code             Current Medications (04/13/2016):  This is the current hospital active medication list Current Facility-Administered Medications  Medication Dose Route Frequency Provider Last Rate Last Dose  . 0.9 %  sodium chloride infusion   Intravenous Continuous Kathryne Hitch, MD 75 mL/hr at 04/13/16 0703    . acetaminophen (TYLENOL) tablet 650 mg  650 mg Oral Q6H PRN Kathryne Hitch, MD   650 mg at 04/13/16 5621   Or  . acetaminophen (TYLENOL) suppository 650 mg  650 mg Rectal Q6H PRN Kathryne Hitch, MD      . alum & mag hydroxide-simeth (MAALOX/MYLANTA) 200-200-20 MG/5ML suspension 30 mL  30 mL Oral Q4H PRN Kathryne Hitch, MD      . carvedilol (COREG) tablet 12.5 mg  12.5 mg Oral BID WC Maryann Mikhail, DO   12.5 mg at 04/13/16 1214  . fentaNYL (SUBLIMAZE) injection 50 mcg  50 mcg Intravenous Q2H PRN [name], MD   50 mcg at 04/12/16 0846  . hydrochlorothiazide (HYDRODIURIL) tablet 25 mg  25 mg Oral Daily Maryann Mikhail, DO   25 mg at 04/13/16 1215  . HYDROcodone-acetaminophen (NORCO/VICODIN) 5-325 MG per tablet 1-2 tablet  1-2 tablet Oral Q6H PRN Kathryne Hitch, MD      . hydrocortisone (ANUSOL-HC) 2.5 % rectal cream 1 application  1 application Rectal BID PRN Edsel Petrin, DO      .  insulin aspart (novoLOG) injection 0-15 Units  0-15 Units Subcutaneous Q4H Maryann Mikhail, DO   2 Units at 04/13/16 0848  . lip balm (CARMEX) ointment   Topical Once Ankit Nanavati, MD      . magnesium sulfate IVPB 4 g 100 mL  4 g Intravenous Once Rodolph Bonganiel V Thompson, MD      . menthol-cetylpyridinium (CEPACOL) lozenge 3 mg  1 lozenge Oral PRN Kathryne Hitchhristopher Y Blackman, MD       Or  . phenol (CHLORASEPTIC) mouth spray 1 spray  1 spray Mouth/Throat PRN Kathryne Hitchhristopher Y Blackman, MD      . methocarbamol (ROBAXIN) tablet 500 mg  500 mg Oral Q6H PRN Kathryne Hitchhristopher Y Blackman, MD   500 mg at 04/13/16 [id number]   Or  . methocarbamol  (ROBAXIN) 500 mg in dextrose 5 % 50 mL IVPB  500 mg Intravenous Q6H PRN Kathryne Hitchhristopher Y Blackman, MD   500 mg at 04/12/16 1300  . metoCLOPramide (REGLAN) tablet 5-10 mg  5-10 mg Oral Q8H PRN Kathryne Hitchhristopher Y Blackman, MD       Or  . metoCLOPramide (REGLAN) injection 5-10 mg  5-10 mg Intravenous Q8H PRN Kathryne Hitchhristopher Y Blackman, MD      . morphine 2 MG/ML injection 0.5 mg  0.5 mg Intravenous Q2H PRN Kathryne Hitchhristopher Y Blackman, MD      . NIFEdipine (PROCARDIA-XL/ADALAT CC) 24 hr tablet 30 mg  30 mg Oral Daily Maryann Mikhail, DO   30 mg at 04/13/16 1215  . ondansetron (ZOFRAN) tablet 4 mg  4 mg Oral Q6H PRN Kathryne Hitchhristopher Y Blackman, MD       Or  . ondansetron [facility](ZOFRAN) injection 4 mg  4 mg Intravenous Q6H PRN Kathryne Hitchhristopher Y Blackman, MD      . oxyCODONE (Oxy IR/ROXICODONE) immediate release tablet 5-10 mg  5-10 mg Oral Q4H PRN Kathryne Hitchhristopher Y Blackman, MD   5 mg at 04/12/16 1620  . polyethylene glycol (MIRALAX / GLYCOLAX) packet 17 g  17 g Oral Daily PRN Maryann Mikhail, DO      . potassium chloride SA (K-DUR,KLOR-CON) CR tablet 40 mEq  40 mEq Oral Q4H Rodolph Bonganiel V Thompson, MD   40 mEq at 04/13/16 1215  . ramipril (ALTACE) capsule 10 mg  10 mg Oral BID Maryann Mikhail, DO   10 mg at 04/13/16 1214     Discharge Medications: Please see discharge summary for a list of discharge medications.  Relevant Imaging Results:  Relevant Lab Results:   Additional Information SS # [id number]  Charles Welch, Dickey GaveJamie Lee, LCSW

## 2016-04-13 NOTE — [place] FL2 (Signed)
[place] MEDICAID FL2 LEVEL OF CARE SCREENING TOOL     IDENTIFICATION  Patient Name: Charles Welch Birthdate: 1945-07-08 Sex: male Admission Date (Current Location): 04/12/2016  [facility] and [place] Number:  Producer, television/film/video and Address:  [facility],  [address] [place] [address]      Provider Number: [id number]  Attending Physician Name and Address:  Rodolph Bong, MD  Relative Name and Phone Number:       Current Level of Care: Hospital Recommended Level of Care: Skilled Nursing Facility Prior Approval Number:    Date Approved/Denied:   PASRR Number: [id number]  Discharge Plan: SNF    Current Diagnoses: Patient Active Problem List   Diagnosis Date Noted  . Hip fracture (HCC) 04/12/2016  . Closed fracture of femur, intertrochanteric, left, initial encounter (HCC) 04/12/2016  . Essential hypertension 04/12/2016  . H/O: CVA (cerebrovascular accident) 04/12/2016  . Poorly controlled type 2 diabetes mellitus with circulatory disorder (HCC) 07/29/2015    Orientation RESPIRATION BLADDER Height & Weight     Self, Time, Situation, Place  Normal Indwelling catheter Weight: 171 lb (77.6 kg) Height:     BEHAVIORAL SYMPTOMS/MOOD NEUROLOGICAL BOWEL NUTRITION STATUS  Other (Comment) (no behaviors)   Continent Diet  AMBULATORY STATUS COMMUNICATION OF NEEDS Skin   Extensive Assist Verbally Surgical wounds                       Personal Care Assistance Level of Assistance  Bathing, Feeding, Dressing Bathing Assistance: Maximum assistance Feeding assistance: Independent Dressing Assistance: Maximum assistance     Functional Limitations Info  Sight, Hearing, Speech Sight Info: Adequate Hearing Info: Adequate Speech Info: Adequate    SPECIAL CARE FACTORS FREQUENCY  PT (By licensed PT), OT (By licensed OT)     PT Frequency: 5x wk OT Frequency: 5x wk            Contractures Contractures Info: Not present    Additional Factors  Info  Code Status Code Status Info: Full Code             Current Medications (04/13/2016):  This is the current hospital active medication list Current Facility-Administered Medications  Medication Dose Route Frequency Provider Last Rate Last Dose  . 0.9 %  sodium chloride infusion   Intravenous Continuous Kathryne Hitch, MD 75 mL/hr at 04/13/16 0703    . acetaminophen (TYLENOL) tablet 650 mg  650 mg Oral Q6H PRN Kathryne Hitch, MD   650 mg at 04/13/16 5621   Or  . acetaminophen (TYLENOL) suppository 650 mg  650 mg Rectal Q6H PRN Kathryne Hitch, MD      . alum & mag hydroxide-simeth (MAALOX/MYLANTA) 200-200-20 MG/5ML suspension 30 mL  30 mL Oral Q4H PRN Kathryne Hitch, MD      . carvedilol (COREG) tablet 12.5 mg  12.5 mg Oral BID WC Maryann Mikhail, DO   12.5 mg at 04/13/16 1214  . fentaNYL (SUBLIMAZE) injection 50 mcg  50 mcg Intravenous Q2H PRN [name], MD   50 mcg at 04/12/16 0846  . hydrochlorothiazide (HYDRODIURIL) tablet 25 mg  25 mg Oral Daily Maryann Mikhail, DO   25 mg at 04/13/16 1215  . HYDROcodone-acetaminophen (NORCO/VICODIN) 5-325 MG per tablet 1-2 tablet  1-2 tablet Oral Q6H PRN Kathryne Hitch, MD      . hydrocortisone (ANUSOL-HC) 2.5 % rectal cream 1 application  1 application Rectal BID PRN Edsel Petrin, DO      .  insulin aspart (novoLOG) injection 0-15 Units  0-15 Units Subcutaneous Q4H Maryann Mikhail, DO   2 Units at 04/13/16 0848  . lip balm (CARMEX) ointment   Topical Once Ankit Nanavati, MD      . magnesium sulfate IVPB 4 g 100 mL  4 g Intravenous Once Rodolph Bonganiel V Thompson, MD      . menthol-cetylpyridinium (CEPACOL) lozenge 3 mg  1 lozenge Oral PRN Kathryne Hitchhristopher Y Blackman, MD       Or  . phenol (CHLORASEPTIC) mouth spray 1 spray  1 spray Mouth/Throat PRN Kathryne Hitchhristopher Y Blackman, MD      . methocarbamol (ROBAXIN) tablet 500 mg  500 mg Oral Q6H PRN Kathryne Hitchhristopher Y Blackman, MD   500 mg at 04/13/16 [id number]   Or  . methocarbamol  (ROBAXIN) 500 mg in dextrose 5 % 50 mL IVPB  500 mg Intravenous Q6H PRN Kathryne Hitchhristopher Y Blackman, MD   500 mg at 04/12/16 1300  . metoCLOPramide (REGLAN) tablet 5-10 mg  5-10 mg Oral Q8H PRN Kathryne Hitchhristopher Y Blackman, MD       Or  . metoCLOPramide (REGLAN) injection 5-10 mg  5-10 mg Intravenous Q8H PRN Kathryne Hitchhristopher Y Blackman, MD      . morphine 2 MG/ML injection 0.5 mg  0.5 mg Intravenous Q2H PRN Kathryne Hitchhristopher Y Blackman, MD      . NIFEdipine (PROCARDIA-XL/ADALAT CC) 24 hr tablet 30 mg  30 mg Oral Daily Maryann Mikhail, DO   30 mg at 04/13/16 1215  . ondansetron (ZOFRAN) tablet 4 mg  4 mg Oral Q6H PRN Kathryne Hitchhristopher Y Blackman, MD       Or  . ondansetron [facility](ZOFRAN) injection 4 mg  4 mg Intravenous Q6H PRN Kathryne Hitchhristopher Y Blackman, MD      . oxyCODONE (Oxy IR/ROXICODONE) immediate release tablet 5-10 mg  5-10 mg Oral Q4H PRN Kathryne Hitchhristopher Y Blackman, MD   5 mg at 04/12/16 1620  . polyethylene glycol (MIRALAX / GLYCOLAX) packet 17 g  17 g Oral Daily PRN Maryann Mikhail, DO      . potassium chloride SA (K-DUR,KLOR-CON) CR tablet 40 mEq  40 mEq Oral Q4H Rodolph Bonganiel V Thompson, MD   40 mEq at 04/13/16 1215  . ramipril (ALTACE) capsule 10 mg  10 mg Oral BID Maryann Mikhail, DO   10 mg at 04/13/16 1214     Discharge Medications: Please see discharge summary for a list of discharge medications.  Relevant Imaging Results:  Relevant Lab Results:   Additional Information SS # [id number] Onesimo Lingard, Dickey GaveJamie Lee, LCSW

## 2016-04-13 NOTE — Evaluation (Signed)
Occupational Therapy Evaluation Patient Details Name: Charles GoldsRonald D Welch MRN: [id number] DOB: 1945-09-10 Today's Date: 04/13/2016    History of Present Illness This 71 year old man was admitted after fall resulting in L intertrochanteric fx.  He is s/p IM nail.  H/o CVA with L sided weakness   Clinical Impression   Pt was admitted for the above.  He was mod I with adls prior to admission and now he needs max +2 A for adls at bed level.  Pt is TDWB on L and did not stand this session.     Follow Up Recommendations  SNF    Equipment Recommendations   (defer to next venue)    Recommendations for Other Services       Precautions / Restrictions Precautions Precautions: Fall Restrictions Weight Bearing Restrictions: Yes LLE Weight Bearing: Touchdown weight bearing      Mobility Bed Mobility Overal bed mobility: Needs Assistance Bed Mobility: Rolling;Supine to Sit;Sit to Supine Rolling: Max assist;+2 for physical assistance   Supine to sit: Max assist;+2 for physical assistance Sit to supine: Max assist;+2 for physical assistance   General bed mobility comments: partially rolled to L for linen change  Transfers                 General transfer comment: not attempted    Balance Overall balance assessment: History of Falls;Needs assistance   Sitting balance-Leahy Scale: Poor Sitting balance - Comments: pt relies on bedrail when sitting. Able to release but needed min guard when washing face                                    ADL Overall ADL's : Needs assistance/impaired     Grooming: Set up;Sitting;Wash/dry face   Upper Body Bathing: Moderate assistance;Sitting   Lower Body Bathing: Maximal assistance;+2 for physical assistance;Bed level (sit to roll)   Upper Body Dressing : Maximal assistance;Sitting   Lower Body Dressing: Maximal assistance;+2 for physical assistance;Bed level                 General ADL Comments: pt sat eob; had  difficulty keeping R leg flat on floor for better support and balance.  Relied on bedrail for balance.  Did not attempt to stand this session.  Pt wears a brace on L ankle and he is TDWB.  Partially rolled to change bed linen     Vision     Perception     Praxis      Pertinent Vitals/Pain Pain Assessment: Faces Faces Pain Scale: Hurts Grossberg more Pain Location: LLE Pain Descriptors / Indicators: Grimacing Pain Intervention(s): Limited activity within patient's tolerance;Monitored during session;Premedicated before session;Repositioned     Hand Dominance     Extremity/Trunk Assessment Upper Extremity Assessment Upper Extremity Assessment: LUE deficits/detail LUE Deficits / Details: contracted from CVA.  Has minimal movement at elbow           Communication Communication Communication: No difficulties   Cognition Arousal/Alertness: Awake/alert Behavior During Therapy: WFL for tasks assessed/performed Overall Cognitive Status: No family/caregiver present to determine baseline cognitive functioning                 General Comments: pt distractible, talks a lot.  Needed repeated cues at times   General Comments       Exercises       Shoulder Instructions      Home Living Family/patient expects  to be discharged to:: Unsure                                 Additional Comments: lives alone, has PCA who is there intermittently      Prior Functioning/Environment Level of Independence: Independent with assistive device(s)        Comments: uses quad cane. Pt reports 2 falls in last week        OT Problem List: Decreased strength;Decreased activity tolerance;Decreased range of motion;Impaired balance (sitting and/or standing);Decreased cognition;Decreased knowledge of use of DME or AE;Pain   OT Treatment/Interventions: Self-care/ADL training;DME and/or AE instruction;Therapeutic activities;Cognitive remediation/compensation;Patient/family  education;Balance training    OT Goals(Current goals can be found in the care plan section) Acute Rehab OT Goals Patient Stated Goal: none stated OT Goal Formulation: With patient Time For Goal Achievement: 04/27/16 Potential to Achieve Goals: Fair ADL Goals Pt Will Transfer to Toilet: with max assist;with +2 assist;with transfer board;bedside commode Additional ADL Goal #1: pt will sit eob and complete UB adls with supervision Additional ADL Goal #2: pt will complete bed mobility with mod +2 assistance in preparation for adls  OT Frequency: Min 2X/week   Barriers to D/C:            Co-evaluation PT/OT/SLP Co-Evaluation/Treatment: Yes Reason for Co-Treatment: Complexity of the patient's impairments (multi-system involvement) PT goals addressed during session: Mobility/safety with mobility;Balance OT goals addressed during session: ADL's and self-care      End of Session    Activity Tolerance: Patient tolerated treatment well Patient left: in bed;with call bell/phone within reach;with chair alarm set   Time: 1610-9604 OT Time Calculation (min): 19 min Charges:  OT General Charges $OT Visit: 1 Procedure OT Evaluation $OT Eval Moderate Complexity: 1 Procedure G-Codes:    Gee Habig 2016/05/03, 11:51 AM Marica Otter, OTR/L [phone] 05-03-16

## 2016-04-13 NOTE — Progress Notes (Signed)
Plan for d/c to SNF, discharge planning per CSW. [phone] 

## 2016-04-13 NOTE — Evaluation (Signed)
Physical Therapy Evaluation Patient Details Name: Charles Welch MRN: [id number] DOB: [birth year] Today's Date: 04/13/2016   History of Present Illness  This 71 year old man was admitted after fall resulting in L intertrochanteric fx.  He is s/p IM nail.  H/o CVA with L sided weakness  Clinical Impression  The patient  Is quite limited in mobility due to Left hemiparesis. Assisted by 2 to sitting on the bed edge. Will not attempt standing until his left AFO is  Available, Pt admitted with above diagnosis. Pt currently with functional limitations due to the deficits listed below (see PT Problem List).  Pt will benefit from skilled PT to increase their independence and safety with mobility to allow discharge to the venue listed below.       Follow Up Recommendations SNF    Equipment Recommendations  None recommended by PT    Recommendations for Other Services       Precautions / Restrictions Precautions Precautions: Fall Required Braces or Orthoses: Other Brace/Splint Other Brace/Splint: patient has an AFO for left ankle, states that he has to wear it., not on room Restrictions LLE Weight Bearing: Touchdown weight bearing      Mobility  Bed Mobility Overal bed mobility: Needs Assistance Bed Mobility: Rolling;Supine to Sit;Sit to Supine Rolling: Max assist;+2 for physical assistance   Supine to sit: Max assist;+2 for physical assistance Sit to supine: Max assist;+2 for physical assistance   General bed mobility comments: partially rolled to L for linen change  Transfers                 General transfer comment: not attempted  Ambulation/Gait                Stairs            Wheelchair Mobility    Modified Rankin (Stroke Patients Only)       Balance Overall balance assessment: History of Falls;Needs assistance   Sitting balance-Leahy Scale: Poor Sitting balance - Comments: pt relies on bedrail when sitting. Able to release but needed min guard  when washing face                                     Pertinent Vitals/Pain Pain Assessment: Faces Faces Pain Scale: Hurts Charles Welch more Pain Location: L hip Pain Descriptors / Indicators: Grimacing;Guarding;Sore Pain Intervention(s): Monitored during session;Premedicated before session;Repositioned    Home Living Family/patient expects to be discharged to:: Unsure                 Additional Comments: lives alone, has PCA who is there intermittently    Prior Function Level of Independence: Independent with assistive device(s)         Comments: uses quad cane. Pt reports 2 falls in last week, uses scooter to  get to car.     Hand Dominance        Extremity/Trunk Assessment   Upper Extremity Assessment Upper Extremity Assessment: Defer to OT evaluation LUE Deficits / Details: contracted from CVA.  Has minimal movement at elbow    Lower Extremity Assessment Lower Extremity Assessment: LLE deficits/detail LLE Deficits / Details: equinovarus deformity, assistance for the left leg.    Cervical / Trunk Assessment Cervical / Trunk Assessment: Kyphotic;Other exceptions Cervical / Trunk Exceptions: elongated trunk on the right.  Communication   Communication: No difficulties  Cognition Arousal/Alertness: Awake/alert Behavior During Therapy: WFL for  tasks assessed/performed Overall Cognitive Status: No family/caregiver present to determine baseline cognitive functioning                 General Comments: pt distractible, talks a lot.  Needed repeated cues at times    General Comments      Exercises     Assessment/Plan    PT Assessment Patient needs continued PT services  PT Problem List Decreased strength;Decreased range of motion;Decreased activity tolerance;Decreased balance;Decreased mobility;Decreased knowledge of precautions;Decreased safety awareness;Decreased knowledge of use of DME          PT Treatment Interventions Functional  mobility training;Therapeutic activities;Therapeutic exercise;DME instruction    PT Goals (Current goals can be found in the Care Plan section)  Acute Rehab PT Goals Patient Stated Goal: none stated PT Goal Formulation: With patient Time For Goal Achievement: 04/27/16 Potential to Achieve Goals: Good    Frequency Min 3X/week   Barriers to discharge Decreased caregiver support      Co-evaluation PT/OT/SLP Co-Evaluation/Treatment: Yes Reason for Co-Treatment: Complexity of the patient's impairments (multi-system involvement) PT goals addressed during session: Mobility/safety with mobility OT goals addressed during session: ADL's and self-care       End of Session   Activity Tolerance: Patient tolerated treatment well Patient left: in bed;with call bell/phone within reach;with bed alarm set;with nursing/sitter in room Nurse Communication: Mobility status         Time: 1610-9604 PT Time Calculation (min) (ACUTE ONLY): 24 min   Charges:   PT Evaluation $PT Eval Moderate Complexity: 1 Procedure     PT G CodesSharen Welch PT [phone]  04/13/2016, 12:18 PM

## 2016-04-13 NOTE — Progress Notes (Signed)
Subjective: 1 Day Post-Op Procedure(s) (LRB): INTRAMEDULLARY (IM) NAIL INTERTROCHANTRIC (Left) Patient reports pain as moderate.  Acute blood loss anemia from his fracture and surgery.  Objective: Vital signs in last 24 hours: Temp:  [97.6 F (36.4 C)-98.9 F (37.2 C)] 97.7 F (36.5 C) (01/02 0501) Pulse Rate:  [98-129] 102 (01/02 0501) Resp:  [13-22] 16 (01/02 0501) BP: (85-149)/(47-69) 97/49 (01/02 0501) SpO2:  [94 %-100 %] 98 % (01/02 0501) Weight:  [171 lb (77.6 kg)] 171 lb (77.6 kg) (01/01 1330)  Intake/Output from previous day: 01/01 0701 - 01/02 0700 In: 2405 [P.O.:580; I.V.:1625; IV Piggyback:200] Out: 1050 [Urine:1000; Blood:50] Intake/Output this shift: No intake/output data recorded.   Recent Labs  04/12/16 0448 04/13/16 0428  HGB 13.6 10.2*    Recent Labs  04/12/16 0448 04/13/16 0428  WBC 20.5* 15.6*  RBC 4.24 3.13*  HCT 39.4 29.3*  PLT 362 267    Recent Labs  04/12/16 0448 04/13/16 0428  NA 136 137  K 3.3* 3.2*  CL 95* 100*  CO2 28 28  BUN 17 23*  CREATININE 0.91 1.07  GLUCOSE 372* 166*  CALCIUM 9.4 8.5*    Recent Labs  04/12/16 0448  INR 1.03    Sensation intact distally Intact pulses distally Dorsiflexion/Plantar flexion intact Incision: scant drainage  Assessment/Plan: 1 Day Post-Op Procedure(s) (LRB): INTRAMEDULLARY (IM) NAIL INTERTROCHANTRIC (Left) Up with therapy - will need to be touch down weight only due to very soft bone. Will likely need skilled nursing.  Kathryne HitchChristopher Y Blackman 04/13/2016, 7:02 AM

## 2016-04-14 DIAGNOSIS — S72002A Fracture of unspecified part of neck of left femur, initial encounter for closed fracture: Secondary | ICD-10-CM

## 2016-04-14 LAB — BASIC METABOLIC PANEL
Anion gap: 4 — ABNORMAL LOW (ref 5–15)
BUN: 16 mg/dL (ref 6–20)
CO2: 31 mmol/L (ref 22–32)
Calcium: 8.3 mg/dL — ABNORMAL LOW (ref 8.9–10.3)
Chloride: 101 mmol/L (ref 101–111)
Creatinine, Ser: 0.87 mg/dL (ref 0.61–1.24)
GFR calc Af Amer: >60 mL/min (ref >60)
GFR calc non Af Amer: >60 mL/min (ref >60)
Glucose, Bld: 148 mg/dL — ABNORMAL HIGH (ref 65–99)
Potassium: 3.2 mmol/L — ABNORMAL LOW (ref 3.5–5.1)
Sodium: 136 mmol/L (ref 135–145)

## 2016-04-14 LAB — CBC
HCT: 23.3 % — ABNORMAL LOW (ref 39.0–52.0)
Hemoglobin: 8.2 g/dL — ABNORMAL LOW (ref 13.0–17.0)
MCH: 33.2 pg (ref 26.0–34.0)
MCHC: 35.2 g/dL (ref 30.0–36.0)
MCV: 94.3 fL (ref 78.0–100.0)
Platelets: 235 10*3/uL (ref 150–400)
RBC: 2.47 MIL/uL — ABNORMAL LOW (ref 4.22–5.81)
RDW: 13 % (ref 11.5–15.5)
WBC: 10.8 10*3/uL — ABNORMAL HIGH (ref 4.0–10.5)

## 2016-04-14 LAB — GLUCOSE, CAPILLARY
GLUCOSE-CAPILLARY: 143 mg/dL — AB (ref 65–99)
Glucose-Capillary: 117 mg/dL — ABNORMAL HIGH (ref 65–99)
Glucose-Capillary: 252 mg/dL — ABNORMAL HIGH (ref 65–99)
Glucose-Capillary: 255 mg/dL — ABNORMAL HIGH (ref 65–99)
Glucose-Capillary: 254 mg/dL — ABNORMAL HIGH (ref 65–99)

## 2016-04-14 LAB — LIPID PANEL
Cholesterol: 108 mg/dL (ref 0–200)
HDL: 35 mg/dL — ABNORMAL LOW (ref >40)
LDL Cholesterol: 57 mg/dL (ref 0–99)
Total CHOL/HDL Ratio: 3.1 RATIO
Triglycerides: 81 mg/dL (ref <150)
VLDL: 16 mg/dL (ref 0–40)

## 2016-04-14 MED ORDER — INSULIN ASPART 100 UNIT/ML ~~LOC~~ SOLN
0.0000 [IU] | Freq: Three times a day (TID) | SUBCUTANEOUS | Status: DC
  Administered 2016-04-14: 19:00:00 8 [IU] via SUBCUTANEOUS
  Administered 2016-04-15: 08:00:00 3 [IU] via SUBCUTANEOUS
  Administered 2016-04-15: 11 [IU] via SUBCUTANEOUS

## 2016-04-14 MED ORDER — ENSURE ENLIVE PO LIQD
237.0000 mL | Freq: Two times a day (BID) | ORAL | Status: DC
  Administered 2016-04-15: 237 mL via ORAL

## 2016-04-14 MED ORDER — HYDROCODONE-ACETAMINOPHEN 5-325 MG PO TABS: 1.0000 | ORAL_TABLET | Freq: Four times a day (QID) | ORAL | 0 refills | Status: AC | PRN

## 2016-04-14 NOTE — Clinical Social Work Placement (Signed)
   CLINICAL SOCIAL WORK PLACEMENT  NOTE  Date:  04/14/2016  Patient Details  Name: Charles Welch MRN: [id number] Date of Birth: 1945/11/30  Clinical Social Work is seeking post-discharge placement for this patient at the Skilled  Nursing Facility level of care (*CSW will initial, date and re-position this form in  chart as items are completed):  Yes   Patient/family provided with [facility] Clinical Social Work Department's list of facilities offering this level of care within the geographic area requested by the patient (or if unable, by the patient's family).  Yes   Patient/family informed of their freedom to choose among providers that offer the needed level of care, that participate in Medicare, Medicaid or managed care program needed by the patient, have an available bed and are willing to accept the patient.  Yes   Patient/family informed of [facility]'s ownership interest in [facility] and [facility], as well as of the fact that they are under no obligation to receive care at these facilities.  PASRR submitted to EDS on 04/13/16     PASRR number received on 04/13/16     Existing PASRR number confirmed on       FL2 transmitted to all facilities in geographic area requested by pt/family on 04/13/16     FL2 transmitted to all facilities within larger geographic area on       Patient informed that his/her managed care company has contracts with or will negotiate with certain facilities, including the following:        Yes   Patient/family informed of bed offers received.  [facility]     Physician recommends and patient chooses bed at      Patient to be transferred to   on  .  Patient to be transferred to facility by       Patient family notified on   of transfer.  Name of family member notified:        PHYSICIAN       Additional Comment:    _______________________________________________ Royetta AsalHaidinger, Amando Chaput Lee,  LCSW  [phone] 04/14/2016, 8:19 AM

## 2016-04-14 NOTE — Discharge Instructions (Signed)
Ne dry dressing left hip incisions daily as needed. Only touch down weight bearing on left leg.

## 2016-04-14 NOTE — Progress Notes (Signed)
Patient ID: Charles Welch, male   DOB: [birth year], 71 y.o.   MRN: [id number] No acute changes.  Left hip stable.  Can be discharged to skilled nursing from Ortho standpoint.

## 2016-04-14 NOTE — Clinical Social Work Note (Signed)
Clinical Social Work Assessment  Patient Details  Name: Charles Welch MRN: [id number] Date of Birth: [birth year]  Date of referral:  04/14/16               Reason for consult:  Discharge Planning                Permission sought to share information with:  Chartered certified accountant granted to share information::  Yes, Verbal Permission Granted  Name::        Agency::     Relationship::     Contact Information:     Housing/Transportation Living arrangements for the past 2 months:  Single Family Home Source of Information:  Patient, Adult Children Patient Interpreter Needed:  None Criminal Activity/Legal Involvement Pertinent to Current Situation/Hospitalization:  No - Comment as needed Significant Relationships:  Adult Children Lives with:  Self Do you feel safe going back to the place where you live?  No (SNF needed.) Need for family participation in patient care:  Yes (Comment)  Care giving concerns: Pt's care cannot be managed at home following hospital d/c.   Social Worker assessment / plan:  Pt hospitalized on 04/12/16 with a closed left hip fx. Surgery has been completed. CSW met with pt / spoke with step daughter on 1/2 to assist with d/c planning. PT has recommended SNF at d/c. Pt / family are in agreement with this plan. SNF search has been initiated and bed offers provided. Pt / family are interested in [facility]. SNF contacted and has made a bed offer. CSW will continue to follow to assist with d/c planning needs.  Employment status:  Retired Forensic scientist:  Medicare PT Recommendations:  [place] / Referral to community resources:  [place]  Patient/Family's Response to care:  Pt / family feel SNF placement is needed.  Patient/Family's Understanding of and Emotional Response to Diagnosis, Current Treatment, and Prognosis:   Pt / family are aware of pt's medical status. Pt is motivated to work with  therapy. Pt / family appreciate CSW assistance with d/c planning.  Emotional Assessment Appearance:  Appears stated age Attitude/Demeanor/Rapport:  Other (cooperative) Affect (typically observed):  Pleasant, Appropriate Orientation:  Oriented to Self, Oriented to Place, Oriented to  Time, Oriented to Situation Alcohol / Substance use:  Not Applicable Psych involvement (Current and /or in the community):  No (Comment)  Discharge Needs  Concerns to be addressed:  Discharge Planning Concerns Readmission within the last 30 days:  No Current discharge risk:  None Barriers to Discharge:      [name]  [phone] 04/14/2016, 8:12 AM

## 2016-04-14 NOTE — Progress Notes (Signed)
[facility] PROGRESS NOTE    Progress Note  Charles GoldsRonald D Amiri  WUJ:[id number] DOB: [birth year] DOA: 04/12/2016 PCP: Redmond BasemanWONG,[name], MD     Brief Narrative:   Charles Welch is an 71 y.o. male history of prior stroke, diabetes, hypertension presented to the ED after a fall with left leg pain x-rays showing left hip fracture. Orthopedics were consulted patient underwent ORIF 04/12/2016.  Assessment/Plan:   Closed left comminuted intertrochanteric hip/femur fracture Secondary to mechanical fall. Status post ORIF per Dr. Magnus IvanBlackman orthopedics.  PT/OT rec skilled nursing facility.  Leukocytosis Likely reactive leukocytosis secondary to problem #1.  Patient afebrile. Chest x-ray unremarkable. Leukocytosis resolved  Acute blood loss anemia/iron deficiency anemia Hemoglobin currently at 10.2 from 13.6 on admission. Patient with no overt bleeding.  Oral iron supplementation on discharge.  Essential hypertension Blood pressure has been improving slowly Continue Coreg and Procardia.  History of CVA Stable. Plavix on hold. Continue aspirin for secondary stroke prevention. Check a fasting lipid panel.  Diabetes mellitus type 2 Check a hemoglobin A1c. CBGs have ranged from 141 -246. Continue to hold oral hypoglycemic agents. As noted per admitting physician that patient was not on insulin prior to admission secondary to compliance. Place on Lantus 10 units daily. Continue sliding scale insulin.  Hypokalemia Likely secondary to diuretics. Repeat intake orally.  DVT prophylaxis: lovenox Family Communication:none Disposition Plan/Barrier to D/C: SNF in am Code Status:     Code Status Orders        Start     Ordered   04/12/16 1408  Full code  Continuous     04/12/16 1407    Code Status History    Date Active Date Inactive Code Status Order ID Comments User Context   04/12/2016  2:08 PM 04/12/2016  2:08 PM Full Code [id number]  Edsel PetrinMaryann Mikhail, DO Inpatient    Advance  Directive Documentation   Flowsheet Row Most Recent Value  Type of Advance Directive  Healthcare Power of Attorney  Pre-existing out of facility DNR order (yellow form or pink MOST form)  No data  "MOST" Form in Place?  No data        IV Access:    Peripheral IV   Procedures and diagnostic studies:   Pelvis Portable  Result Date: 04/12/2016 CLINICAL DATA:  Patient status post ORIF left femur fracture. EXAM: PORTABLE PELVIS 1-2 VIEWS COMPARISON:  Pelvic radiograph 04/12/2016. FINDINGS: Two views of the left femur were submitted. Patient status post intramedullary rod and screw fixation of previously visualized left femur fracture. Hardware appears in appropriate position. IMPRESSION: Patient status post ORIF left femur fracture. Electronically Signed   By: Annia Beltrew  Davis M.D.   On: 04/12/2016 12:50     Medical Consultants:    None.  Anti-Infectives:   None  Subjective:    Deirdre PeerRonald D Kluender pain is controlled no complaints.  Objective:    Vitals:   04/13/16 1505 04/13/16 1648 04/13/16 2054 04/14/16 0457  BP:  (!) 146/64 (!) 111/46 (!) 126/51  Pulse: (!) 105 (!) 115 (!) 104 94  Resp:   20 18  Temp: 98.7 F (37.1 C)  98.4 F (36.9 C) 98.6 F (37 C)  TempSrc: Oral  Oral Oral  SpO2: 100%  96% 98%  Weight:      Height:        Intake/Output Summary (Last 24 hours) at 04/14/16 1215 Last data filed at 04/14/16 1100  Gross per 24 hour  Intake  2760 ml  Output             1050 ml  Net             1710 ml   Filed Weights   04/12/16 1330  Weight: 77.6 kg (171 lb)    Exam: General exam: In no acute distress. Respiratory system: Good air movement and clear to auscultation. Cardiovascular system: S1 & S2 heard, RRR. No JVD, murmurs, rubs, gallops or clicks.  Gastrointestinal system: Abdomen is nondistended, soft and nontender.  Extremities: No pedal edema. Skin: No rashes, lesions or ulcers Psychiatry: Judgement and insight appear normal. Mood & affect  appropriate.    Data Reviewed:    Labs: Basic Metabolic Panel:  Recent Labs Lab 04/12/16 0448 04/13/16 0428 04/14/16 0447  NA 136 137 136  K 3.3* 3.2* 3.2*  CL 95* 100* 101  CO2 28 28 31   GLUCOSE 372* 166* 148*  BUN 17 23* 16  CREATININE 0.91 1.07 0.87  CALCIUM 9.4 8.5* 8.3*  MG  --  1.6*  --    GFR Estimated Creatinine Clearance: 86.7 mL/min (by C-G formula based on SCr of 0.87 mg/dL). Liver Function Tests: No results for input(s): AST, ALT, ALKPHOS, BILITOT, PROT, ALBUMIN in the last 168 hours. No results for input(s): LIPASE, AMYLASE in the last 168 hours. No results for input(s): AMMONIA in the last 168 hours. Coagulation profile  Recent Labs Lab 04/12/16 0448  INR 1.03    CBC:  Recent Labs Lab 04/12/16 0448 04/13/16 0428 04/14/16 0447  WBC 20.5* 15.6* 10.8*  NEUTROABS 18.6*  --   --   HGB 13.6 10.2* 8.2*  HCT 39.4 29.3* 23.3*  MCV 92.9 93.6 94.3  PLT 362 267 235   Cardiac Enzymes:  Recent Labs Lab 04/12/16 0448  CKTOTAL 123   BNP (last 3 results) No results for input(s): PROBNP in the last 8760 hours. CBG:  Recent Labs Lab 04/13/16 1957 04/13/16 2352 04/14/16 0427 04/14/16 0742 04/14/16 1208  GLUCAP 218* 181* 143* 117* 252*   D-Dimer: No results for input(s): DDIMER in the last 72 hours. Hgb A1c: No results for input(s): HGBA1C in the last 72 hours. Lipid Profile:  Recent Labs  04/14/16 0447  CHOL 108  HDL 35*  LDLCALC 57  TRIG 81  CHOLHDL 3.1   Thyroid function studies: No results for input(s): TSH, T4TOTAL, T3FREE, THYROIDAB in the last 72 hours.  Invalid input(s): FREET3 Anemia work up:  Recent Labs  04/13/16 0940  VITAMINB12 753  FOLATE 26.7  FERRITIN 430*  TIBC 174*  IRON 25*   Sepsis Labs:  Recent Labs Lab 04/12/16 0448 04/13/16 0428 04/14/16 0447  WBC 20.5* 15.6* 10.8*   Microbiology No results found for this or any previous visit (from the past 240 hour(s)).   Medications:   . aspirin   325 mg Oral BID  . carvedilol  12.5 mg Oral BID WC  . insulin aspart  0-15 Units Subcutaneous Q4H  . insulin glargine  10 Units Subcutaneous QHS  . lip balm   Topical Once  . NIFEdipine  30 mg Oral Daily   Continuous Infusions: . sodium chloride 20 mL/hr at 04/14/16 0814    Time spent: 15 min   LOS: 2 days   Marinda Elk  [facility] Pager [phone]  *Please refer to [email or web address], password [email or web address] to get updated schedule on who will round on this patient, as hospitalists switch teams weekly. If 7PM-7AM, please contact night-coverage at [email or web address], password  [email or web address] for any overnight needs.  04/14/2016, 12:15 PM

## 2016-04-14 NOTE — Progress Notes (Signed)
Physical Therapy Treatment Patient Details Name: Charles Welch MRN: [id number] DOB: 24-Oct-1945 Today's Date: 04/14/2016    History of Present Illness This 71 year old man was admitted after fall resulting in L intertrochanteric fx.  He is s/p IM nail.  H/o CVA with L sided weakness    PT Comments    Assisted OOB to recliner total Assist.  Pt was unable to achieve enough upright standing posture to attempt amb.   Follow Up Recommendations  SNF     Equipment Recommendations  None recommended by PT    Recommendations for Other Services       Precautions / Restrictions Precautions Precautions: Fall Precaution Comments: s/p CVA L hemi Required Braces or Orthoses: Other Brace/Splint Other Brace/Splint: patient has an AFO with shoe built in for left ankle Restrictions Weight Bearing Restrictions: Yes LLE Weight Bearing: Touchdown weight bearing    Mobility  Bed Mobility Overal bed mobility: Needs Assistance Bed Mobility: Rolling;Supine to Sit;Sit to Supine Rolling: +2 for physical assistance;Total assist   Supine to sit: +2 for physical assistance;Total assist     General bed mobility comments: assisted to EOB Total Assist pt 5%  Transfers   Equipment used: None Transfers: Stand Pivot Transfers           General transfer comment: elevated bed 1/4 turn to pt's R to recliner while maintaining TTWB L LE      Unable to assist out of lowerlevel recliner.  Hoyer pad placed for nursing to uselift to assistpt back to bed.   Ambulation/Gait                 Stairs            Wheelchair Mobility    Modified Rankin (Stroke Patients Only)       Balance                                    Cognition Arousal/Alertness: Awake/alert Behavior During Therapy: WFL for tasks assessed/performed Overall Cognitive Status: Within Functional Limits for tasks assessed                      Exercises      General Comments         Pertinent Vitals/Pain Pain Assessment: Faces Faces Pain Scale: Hurts whole lot Pain Location: L hip Pain Descriptors / Indicators: Grimacing;Guarding;Sore;Operative site guarding;Tender Pain Intervention(s): Monitored during session;Repositioned    Home Living                      Prior Function            PT Goals (current goals can now be found in the care plan section) Progress towards PT goals: Progressing toward goals    Frequency    Min 3X/week      PT Plan Current plan remains appropriate    Co-evaluation             End of Session Equipment Utilized During Treatment: Gait belt Activity Tolerance: No increased pain;Patient limited by fatigue Patient left: in chair;with call bell/phone within reach     Time: 1002-1028 PT Time Calculation (min) (ACUTE ONLY): 26 min  Charges:  $Therapeutic Activity: 23-37 mins                    G Codes:      Felecia ShellingLori Robecca Fulgham  PTA  [facility]  Acute  Rehab Pager      [phone]

## 2016-04-14 NOTE — Progress Notes (Signed)
Initial Nutrition Assessment  DOCUMENTATION CODES:   Not applicable  INTERVENTION:  Ensure Enlive po BID, each supplement provides 350 kcal and 20 grams of protein.  NUTRITION DIAGNOSIS:   Increased nutrient needs related to other (see comment) (healing s/p ORIF) as evidenced by estimated needs.  GOAL:   Patient will meet greater than or equal to 90% of their needs  MONITOR:   PO intake, Supplement acceptance, Labs, Weight trends, I & O's  REASON FOR ASSESSMENT:   Consult Hip fracture protocol  ASSESSMENT:   71 y.o. male history of prior stroke, diabetes, hypertension presented to the ED after a fall with left leg pain x-rays showing left hip fracture. Orthopedics were consulted patient underwent ORIF 04/12/2016.   Spoke with patient at bedside. He reports appetite is good and unchanged from baseline. He reports he has been coughing while eating due to "sinuses" but reports he does not have any difficulty chewing/swallowing, even with history of CVA. Denies N/V or abdominal pain. Patient reports UBW is 177 lbs and he is weight stable. Per chart patient has lost 5 lbs (3% body weight) over 9 months, which is not significant for time frame.   Meal Completion: 50-100%  Medications reviewed and include: Novolog sliding scale TID with meals, Lantus 10 units daily at bedtime, NS @ 75 ml/hr.   Labs reviewed: CBG 117-252 past 24 hrs, Anion gap 4, Potassium 3.2.  Nutrition-Focused physical exam completed. Findings are no fat depletion, mild muscle depletion, and no edema.   Diet Order:  Diet Carb Modified Fluid consistency: Thin; Room service appropriate? Yes  Skin:  Reviewed, no issues  Last BM:  12/30  Height:   Ht Readings from Last 1 Encounters:  04/12/16 6' (1.829 m)    Weight:   Wt Readings from Last 1 Encounters:  04/12/16 171 lb (77.6 kg)    Ideal Body Weight:  80.9 kg  BMI:  Body mass index is 23.19 kg/m.  Estimated Nutritional Needs:   Kcal:   1800-2000 (MSJ x 1.2-1.3)  Protein:  85-100 grams (1.1-1.3 grams/kg)  Fluid:  >/= 1.9 L/day (25 ml/kg)  EDUCATION NEEDS:   No education needs identified at this time  Helane RimaLeanne Lubertha Leite, MS, RD, LDN Pager: [phone] After Hours Pager: [phone]

## 2016-04-15 LAB — BASIC METABOLIC PANEL
Anion gap: 8 (ref 5–15)
BUN: 19 mg/dL (ref 6–20)
CALCIUM: 8.4 mg/dL — AB (ref 8.9–10.3)
CHLORIDE: 99 mmol/L — AB (ref 101–111)
CO2: 29 mmol/L (ref 22–32)
CREATININE: 0.6 mg/dL — AB (ref 0.61–1.24)
GFR calc non Af Amer: >60 mL/min (ref >60)
Glucose, Bld: 181 mg/dL — ABNORMAL HIGH (ref 65–99)
Potassium: 3.3 mmol/L — ABNORMAL LOW (ref 3.5–5.1)
Sodium: 136 mmol/L (ref 135–145)

## 2016-04-15 LAB — URINALYSIS, ROUTINE W REFLEX MICROSCOPIC
Bacteria, UA: NONE SEEN
Bilirubin Urine: NEGATIVE
Ketones, ur: 20 mg/dL — AB
Leukocytes, UA: NEGATIVE
Nitrite: NEGATIVE
PH: 5 (ref 5.0–8.0)
Protein, ur: NEGATIVE mg/dL
Specific Gravity, Urine: 1.018 (ref 1.005–1.030)
Squamous Epithelial / LPF: NONE SEEN

## 2016-04-15 LAB — CBC
HEMATOCRIT: 23.2 % — AB (ref 39.0–52.0)
HEMOGLOBIN: 8.2 g/dL — AB (ref 13.0–17.0)
MCH: 32.9 pg (ref 26.0–34.0)
MCHC: 35.3 g/dL (ref 30.0–36.0)
MCV: 93.2 fL (ref 78.0–100.0)
Platelets: 259 10*3/uL (ref 150–400)
RBC: 2.49 MIL/uL — ABNORMAL LOW (ref 4.22–5.81)
RDW: 12.8 % (ref 11.5–15.5)
WBC: 10.4 10*3/uL (ref 4.0–10.5)

## 2016-04-15 LAB — HEMOGLOBIN A1C
HEMOGLOBIN A1C: 8.4 % — AB (ref 4.8–5.6)
MEAN PLASMA GLUCOSE: 194 mg/dL

## 2016-04-15 LAB — GLUCOSE, CAPILLARY
Glucose-Capillary: 170 mg/dL — ABNORMAL HIGH (ref 65–99)
Glucose-Capillary: 314 mg/dL — ABNORMAL HIGH (ref 65–99)

## 2016-04-15 MED ORDER — METFORMIN HCL 500 MG PO TABS
500.0000 mg | ORAL_TABLET | Freq: Two times a day (BID) | ORAL | Status: DC
  Administered 2016-04-15: 500 mg via ORAL
  Filled 2016-04-15: qty 1

## 2016-04-15 MED ORDER — TAMSULOSIN HCL 0.4 MG PO CAPS: 0.4000 mg | ORAL_CAPSULE | Freq: Every day | ORAL | Status: DC

## 2016-04-15 MED ORDER — METFORMIN HCL 500 MG PO TABS: 500.0000 mg | ORAL_TABLET | Freq: Two times a day (BID) | ORAL | Status: DC

## 2016-04-15 MED ORDER — TAMSULOSIN HCL 0.4 MG PO CAPS: 0.4000 mg | ORAL_CAPSULE | Freq: Every day | ORAL | 0 refills | Status: AC

## 2016-04-15 NOTE — Progress Notes (Signed)
Inpatient Diabetes Program Recommendations  AACE/ADA: New Consensus Statement on Inpatient Glycemic Control (2015)  Target Ranges:  Prepandial:   less than 140 mg/dL      Peak postprandial:   less than 180 mg/dL (1-2 hours)      Critically ill patients:  140 - 180 mg/dL   Lab Results  Component Value Date   GLUCAP 170 (H) 04/15/2016   HGBA1C 8.4 (H) 04/14/2016    Review of Glycemic Control  Pt to be d/ced to SNF. Previously on no insulin. Good FBS this am with Lantus 10 units QHS on 04/14/2016.    Inpatient Diabetes Program Recommendations:    Recommend pt be discharged on Lantus 10 units QHS. Restart orals at discharge.  Continue to follow. Thank you. Ailene Ardshonda Romen Yutzy, RD, LDN, CDE Inpatient Diabetes Coordinator [phone]

## 2016-04-15 NOTE — Progress Notes (Signed)
CSW assisting with d/c planning. Pt has SNF bed at [facility] today if stable for d/c.CSW will continue to follow to assist with d/c planning.  Cori RazorJamie Uzoma Vivona LCSW [phone]

## 2016-04-15 NOTE — Plan of Care (Signed)
Problem: Safety: Goal: Ability to remain free from injury will improve Outcome: Adequate for Discharge Discharge to SNF  Problem: Bowel/Gastric: Goal: Will not experience complications related to bowel motility Outcome: Adequate for Discharge SNF aware of last BM date

## 2016-04-15 NOTE — Clinical Social Work Placement (Signed)
   CLINICAL SOCIAL WORK PLACEMENT  NOTE  Date:  04/15/2016  Patient Details  Name: Charles Welch MRN: [id number] Date of Birth: 03/06/1946  Clinical Social Work is seeking post-discharge placement for this patient at the Skilled  Nursing Facility level of care (*CSW will initial, date and re-position this form in  chart as items are completed):  Yes   Patient/family provided with [facility] Clinical Social Work Department's list of facilities offering this level of care within the geographic area requested by the patient (or if unable, by the patient's family).  Yes   Patient/family informed of their freedom to choose among providers that offer the needed level of care, that participate in Medicare, Medicaid or managed care program needed by the patient, have an available bed and are willing to accept the patient.  Yes   Patient/family informed of [place]'s ownership interest in [facility] and [facility], as well as of the fact that they are under no obligation to receive care at these facilities.  PASRR submitted to EDS on 04/13/16     PASRR number received on 04/13/16     Existing PASRR number confirmed on       FL2 transmitted to all facilities in geographic area requested by pt/family on 04/13/16     FL2 transmitted to all facilities within larger geographic area on       Patient informed that his/her managed care company has contracts with or will negotiate with certain facilities, including the following:        Yes   Patient/family informed of bed offers received.  [facility]     Physician recommends and patient chooses bed at      [facility] 04[facility].  Patient to be transferred to facility by [facility]     Patient family notified on 04/15/16 of transfer.  Name of family member notified:  Step daughter     PHYSICIAN       Additional Comment: Pt / family are in  agreement with d/c to [facility] today. [facility] transport is required. Medical necessity form completed. Pt / family are aware that out of pocket costs may be associated with [facility] transport. D/C Summary sent to SNF for review. Scripts included in d/c packet. # for report provided to nsg.     _______________________________________________ Royetta AsalHaidinger, Damarcus Reggio Lee, LCSW  [phone] 04/15/2016, 2:17 PM

## 2016-04-15 NOTE — Progress Notes (Signed)
[facility] L[facility]. Sonja Manseau, [name]

## 2016-04-15 NOTE — Progress Notes (Signed)
Discharged from floor via stretcher for transport to SNF by ambulance. Belongings with pt. No changes in assessment. Aaralyn Kil, [name]

## 2016-04-15 NOTE — Discharge Summary (Signed)
Physician Discharge Summary  Charles Welch JJO:[id number] DOB: 01-Dec-1945 DOA: 04/12/2016  PCP: Redmond BasemanWONG,Charles PATRICK, MD  Admit date: 04/12/2016 Discharge date: 04/15/2016  Admitted From: home Disposition:  SNF  Recommendations for Outpatient Follow-up:  1. Follow up with PCP in 1-2 weeks 2. Please obtain BMP/CBC in one week.  Home Health:No Equipment/Devices:none  Discharge Condition:stable CODE STATUS: Full Diet recommendation: Heart Healthy  Brief/Interim Summary: 71 y.o. male with a medical history of CVA, diabetes mellitus, hypertension, presented to emergency department after falling with left leg pain. X-ray did show fracture. Patient states that he was walking and fell backwards. Denies any dizziness prior to the episode. Patient's daughter does state he has been falling a lot lately and has not been taking care of himself  Discharge Diagnoses:  Principal Problem:   Closed fracture of femur, intertrochanteric, left, initial encounter [facility](HCC) Active Problems:   Poorly controlled type 2 diabetes mellitus with circulatory disorder (HCC)   Hip fracture (HCC)   Essential hypertension   H/O: CVA (cerebrovascular accident)  Closed left comminuted intertrochanteric hip/femur fracture Secondary to mechanical fall. Status post ORIF per Dr. [facility].  PT/OT evalaute the patient who rec skilled nursing facility.  Leukocytosis Likely reactive leukocytosis secondary to problem #1.  Patient afebrile. Chest x-ray unremarkable.   Acute blood loss anemia/iron deficiency anemia Hemoglobin currently at 10.2 from 13.6 on admission. Patient with no overt bleeding.  Oral iron supplementation as an outpatient.  Essential hypertension Blood pressure has been improving slowly Continue Coreg and Procardia.  History of CVA Resume plavix as an outpatient. Continue aspirin for secondary stroke prevention.   Diabetes mellitus type 2  CBGs have ranged from 141 -246.  Cont  current home regimen as an outpatient.  Discharge Instructions  Discharge Instructions    Diet - low sodium heart healthy    Complete by:  As directed    Increase activity slowly    Complete by:  As directed    Touch down weight bearing    Complete by:  As directed    Laterality:  left   Extremity:  Lower     Allergies as of 04/15/2016      Reactions   Canagliflozin Swelling   Clarithromycin Other (See Comments)   Hydrochlorothiazide Other (See Comments)   Penicillin G Itching   Has patient had a PCN reaction causing immediate rash, facial/tongue/throat swelling, SOB or lightheadedness with hypotension: no Has patient had a PCN reaction causing severe rash involving mucus membranes or skin necrosis: no Has patient had a PCN reaction that required hospitalization: no Has patient had a PCN reaction occurring within the last 10 years: no If all of the above answers are "NO", then may proceed with Cephalosporin use.   Sitagliptin Other (See Comments)      Medication List    TAKE these medications   aspirin 81 MG tablet Take 81 mg by mouth daily.   BAYER CONTOUR NEXT TEST test strip Generic drug:  glucose blood 1 each by Other route daily.   carvedilol 12.5 MG tablet Commonly known as:  COREG Take 12.5 mg by mouth 2 (two) times daily with a meal.   clopidogrel 75 MG tablet Commonly known as:  PLAVIX Take 75 mg by mouth daily.   glipiZIDE 10 MG tablet Commonly known as:  GLUCOTROL Take 10 mg by mouth daily before breakfast.   hydrochlorothiazide 25 MG tablet Commonly known as:  HYDRODIURIL Take 25 mg by mouth daily.   HYDROcodone-acetaminophen 5-325 MG tablet Commonly  known as:  NORCO/VICODIN Take 1-2 tablets by mouth every 6 (six) hours as needed for moderate pain.   hydrocortisone 2.5 % rectal cream Commonly known as:  ANUSOL-HC Place 1 application rectally 2 (two) times daily as needed for hemorrhoids or itching.   metFORMIN 500 MG tablet Commonly known as:   GLUCOPHAGE Take 500 mg by mouth 2 (two) times daily after a meal.   NIFEdipine 30 MG 24 hr tablet Commonly known as:  PROCARDIA-XL/ADALAT-CC/NIFEDICAL-XL Take 30 mg by mouth daily.   ramipril 10 MG capsule Commonly known as:  ALTACE Take 10 mg by mouth 2 (two) times daily.      Follow-up Information    Kathryne Hitch, MD. Schedule an appointment as soon as possible for a visit in 2 week(s).   Specialty:  Orthopedic Surgery Contact information: [address] [place] [address] [phone]          Allergies  Allergen Reactions  . Canagliflozin Swelling  . Clarithromycin Other (See Comments)  . Hydrochlorothiazide Other (See Comments)  . Penicillin G Itching    Has patient had a PCN reaction causing immediate rash, facial/tongue/throat swelling, SOB or lightheadedness with hypotension: no Has patient had a PCN reaction causing severe rash involving mucus membranes or skin necrosis: no Has patient had a PCN reaction that required hospitalization: no Has patient had a PCN reaction occurring within the last 10 years: no If all of the above answers are "NO", then may proceed with Cephalosporin use.   . Sitagliptin Other (See Comments)    Consultations:  Ortho   Procedures/Studies: Dg Chest 1 View  Result Date: 04/12/2016 CLINICAL DATA:  Preoperative evaluation, LEFT hip fracture. EXAM: CHEST 1 VIEW COMPARISON:  None. FINDINGS: Cardiomediastinal silhouette is normal. Mild calcific atherosclerosis. No pleural effusions or focal consolidations. Symmetric prominence of the bilateral anterior first ribs. Trachea projects midline and there is no pneumothorax. Soft tissue planes and included osseous structures are non-suspicious. IMPRESSION: No acute cardiopulmonary process. Electronically Signed   By: Awilda Metro M.D.   On: 04/12/2016 04:22   Ct Head Wo Contrast  Result Date: 04/12/2016 CLINICAL DATA:  Ground level fall tonight while walking to the  bathroom. Anticoagulated. EXAM: CT HEAD WITHOUT CONTRAST CT CERVICAL SPINE WITHOUT CONTRAST TECHNIQUE: Multidetector CT imaging of the head and cervical spine was performed following the standard protocol without intravenous contrast. Multiplanar CT image reconstructions of the cervical spine were also generated. COMPARISON:  None. FINDINGS: CT HEAD FINDINGS Brain: There is no intracranial hemorrhage, mass or evidence of acute infarction. There is remote right MCA infarction with encephalomalacia. There is remote left internal capsule infarction. There is mild generalized atrophy. There is patchy white matter hypodensity in both cerebral hemispheres, likely chronic small vessel ischemic disease. Vascular: No hyperdense vessel or unexpected calcification. Skull: Normal. Negative for fracture or focal lesion. Sinuses/Orbits: No acute finding. Other: None. CT CERVICAL SPINE FINDINGS Alignment: Normal. Skull base and vertebrae: No acute fracture. No primary bone lesion or focal pathologic process. Soft tissues and spinal canal: No prevertebral fluid or swelling. No visible canal hematoma. Disc levels: There is severe multilevel degenerative central canal stenosis, greatest at C4-5 and C5-6. Facet articulations are intact and well preserved. Upper chest: Negative. Other: None IMPRESSION: 1. Remote right MCA infarction. Remote left internal capsule infarction. Mild generalized atrophy and moderate chronic white matter hypodensity consistent small vessel ischemic disease. No acute intracranial findings. 2. Negative for acute cervical spine fracture appear 3. 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 Electronically Signed   By: Ellery Plunk M.D.   On: 04/12/2016 04:40   Ct Cervical Spine Wo Contrast  Result Date: 04/12/2016 CLINICAL DATA:  Ground level fall tonight while walking to the bathroom. Anticoagulated. EXAM: CT HEAD WITHOUT CONTRAST CT CERVICAL SPINE WITHOUT  CONTRAST TECHNIQUE: Multidetector CT imaging of the head and cervical spine was performed following the standard protocol without intravenous contrast. Multiplanar CT image reconstructions of the cervical spine were also generated. COMPARISON:  None. FINDINGS: CT HEAD FINDINGS Brain: There is no intracranial hemorrhage, mass or evidence of acute infarction. There is remote right MCA infarction with encephalomalacia. There is remote left internal capsule infarction. There is mild generalized atrophy. There is patchy white matter hypodensity in both cerebral hemispheres, likely chronic small vessel ischemic disease. Vascular: No hyperdense vessel or unexpected calcification. Skull: Normal. Negative for fracture or focal lesion. Sinuses/Orbits: No acute finding. Other: None. CT CERVICAL SPINE FINDINGS Alignment: Normal. Skull base and vertebrae: No acute fracture. No primary bone lesion or focal pathologic process. Soft tissues and spinal canal: No prevertebral fluid or swelling. No visible canal hematoma. Disc levels: There is severe multilevel degenerative central canal stenosis, greatest at C4-5 and C5-6. Facet articulations are intact and well preserved. Upper chest: Negative. Other: None IMPRESSION: 1. Remote right MCA infarction. Remote left internal capsule infarction. Mild generalized atrophy and moderate chronic white matter hypodensity consistent small vessel ischemic disease. No acute intracranial findings. 2. Negative for acute cervical spine fracture appear 3. 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 Electronically Signed   By: Ellery Plunk M.D.   On: 04/12/2016 04:40   Pelvis Portable  Result Date: 04/12/2016 CLINICAL DATA:  Patient status post ORIF left femur fracture. EXAM: PORTABLE PELVIS 1-2 VIEWS COMPARISON:  Pelvic radiograph 04/12/2016. FINDINGS: Two views of the left femur were submitted. Patient status post intramedullary rod and screw  fixation of previously visualized left femur fracture. Hardware appears in appropriate position. IMPRESSION: Patient status post ORIF left femur fracture. Electronically Signed   By: Annia Belt M.D.   On: 04/12/2016 12:50   Dg C-arm 1-60 Min-no Report  Result Date: 04/12/2016 There is no Radiologist interpretation  for this exam.  Dg Hip Unilat With Pelvis 2-3 Views Left  Result Date: 04/12/2016 CLINICAL DATA:  Larey Seat walking to bathroom. LEFT hip pain and deformity. EXAM: DG HIP (WITH OR WITHOUT PELVIS) 2-3V LEFT COMPARISON:  None. FINDINGS: Acute comminuted LEFT femur intertrochanteric fracture with impaction, varus angulation distal bony fragments. No dislocation. Mild bilateral hip osteoarthrosis. Osteopenia without destructive bony lesions. Plastic radiopaque foreign body projects in LEFT abdomen, seen on single view. Moderate vascular calcifications. IMPRESSION: Acute displaced LEFT femur intertrochanteric fracture. No dislocation. Plastic radiopaque foreign body projects in LEFT mid abdomen. Electronically Signed   By: Awilda Metro M.D.   On: 04/12/2016 04:20   Dg Femur Min 2 Views Left  Result Date: 04/12/2016 CLINICAL DATA:  Left IM nail. EXAM: LEFT FEMUR 2 VIEWS COMPARISON:  04/12/2016 FINDINGS: Status post placement of intramedullary nail with lag screw across intertrochanteric fracture of the left hip. Lesser trochanter fragment is again identified. Tips of the hardware are intra osseous. No new fracture identified. IMPRESSION: Status post placement of intramedullary nail with lag screw across intertrochanteric fracture of the left hip. No complicating features identified. Electronically Signed   By: Norva Pavlov M.D.   On: 04/12/2016 12:47     Subjective: No  complains  Discharge Exam: Vitals:   04/14/16 1348 04/14/16 2107  BP: (!) 100/45 (!) 119/50  Pulse: 90 84  Resp: 17 16  Temp: 98.6 F (37 C) 97.6 F (36.4 C)   Vitals:   04/13/16 2054 04/14/16 0457 04/14/16 1348  04/14/16 2107  BP: (!) 111/46 (!) 126/51 (!) 100/45 (!) 119/50  Pulse: (!) 104 94 90 84  Resp: 20 18 17 16   Temp: 98.4 F (36.9 C) 98.6 F (37 C) 98.6 F (37 C) 97.6 F (36.4 C)  TempSrc: Oral Oral Oral Oral  SpO2: 96% 98% 95% 97%  Weight:      Height:        General: Pt is alert, awake, not in acute distress Cardiovascular: RRR, S1/S2 +, no rubs, no gallops Respiratory: CTA bilaterally, no wheezing, no rhonchi Abdominal: Soft, NT, ND, bowel sounds + Extremities: no edema, no cyanosis    The results of significant diagnostics from this hospitalization (including imaging, microbiology, ancillary and laboratory) are listed below for reference.     Microbiology: No results found for this or any previous visit (from the past 240 hour(s)).   Labs: BNP (last 3 results) No results for input(s): BNP in the last 8760 hours. Basic Metabolic Panel:  Recent Labs Lab 04/12/16 0448 04/13/16 0428 04/14/16 0447 04/15/16 0450  NA 136 137 136 136  K 3.3* 3.2* 3.2* 3.3*  CL 95* 100* 101 99*  CO2 28 28 31 29   GLUCOSE 372* 166* 148* 181*  BUN 17 23* 16 19  CREATININE 0.91 1.07 0.87 0.60*  CALCIUM 9.4 8.5* 8.3* 8.4*  MG  --  1.6*  --   --    Liver Function Tests: No results for input(s): AST, ALT, ALKPHOS, BILITOT, PROT, ALBUMIN in the last 168 hours. No results for input(s): LIPASE, AMYLASE in the last 168 hours. No results for input(s): AMMONIA in the last 168 hours. CBC:  Recent Labs Lab 04/12/16 0448 04/13/16 0428 04/14/16 0447 04/15/16 0450  WBC 20.5* 15.6* 10.8* 10.4  NEUTROABS 18.6*  --   --   --   HGB 13.6 10.2* 8.2* 8.2*  HCT 39.4 29.3* 23.3* 23.2*  MCV 92.9 93.6 94.3 93.2  PLT 362 267 235 259   Cardiac Enzymes:  Recent Labs Lab 04/12/16 0448  CKTOTAL 123   BNP: Invalid input(s): POCBNP CBG:  Recent Labs Lab 04/14/16 1208 04/14/16 1815 04/14/16 2103 04/15/16 0715 04/15/16 1205  GLUCAP 252* 255* 254* 170* 314*   D-Dimer No results for  input(s): DDIMER in the last 72 hours. Hgb A1c  Recent Labs  04/14/16 0447  HGBA1C 8.4*   Lipid Profile  Recent Labs  04/14/16 0447  CHOL 108  HDL 35*  LDLCALC 57  TRIG 81  CHOLHDL 3.1   Thyroid function studies No results for input(s): TSH, T4TOTAL, T3FREE, THYROIDAB in the last 72 hours.  Invalid input(s): FREET3 Anemia work up  Recent Labs  04/13/16 0940  VITAMINB12 753  FOLATE 26.7  FERRITIN 430*  TIBC 174*  IRON 25*   Urinalysis    Component Value Date/Time   COLORURINE YELLOW 04/15/2016 0633   APPEARANCEUR CLEAR 04/15/2016 0633   LABSPEC 1.018 04/15/2016 0633   PHURINE 5.0 04/15/2016 0633   GLUCOSEU >=500 (A) 04/15/2016 0633   HGBUR MODERATE (A) 04/15/2016 0633   BILIRUBINUR NEGATIVE 04/15/2016 0633   KETONESUR 20 (A) 04/15/2016 0633   PROTEINUR NEGATIVE 04/15/2016 0633   NITRITE NEGATIVE 04/15/2016 0633   LEUKOCYTESUR NEGATIVE 04/15/2016 0633   Sepsis  Labs Invalid input(s): PROCALCITONIN,  WBC,  LACTICIDVEN Microbiology No results found for this or any previous visit (from the past 240 hour(s)).   Time coordinating discharge: Over 30 minutes  SIGNED:   Marinda Elk, MD  [facility] 04/15/2016, 12:14 PM Pager   If 7PM-7AM, please contact night-coverage [email or web address] Password [email or web address]

## 2016-04-15 NOTE — Progress Notes (Deleted)
[facility] for [facility]. CSW will assist with d/c planning to SNF.  Cori RazorJamie Christerpher Clos LCSW [phone]

## 2016-04-15 NOTE — Progress Notes (Signed)
[name], [name]

## 2016-04-29 ENCOUNTER — Ambulatory Visit (INDEPENDENT_AMBULATORY_CARE_PROVIDER_SITE_OTHER): Payer: Federal, State, Local not specified - PPO | Admitting: Physician Assistant

## 2016-05-13 DEATH — deceased

## 2016-05-18 ENCOUNTER — Ambulatory Visit: Payer: Federal, State, Local not specified - PPO | Admitting: Internal Medicine

## 2017-05-26 IMAGING — CR DG HIP (WITH OR WITHOUT PELVIS) 2-3V*L*
3 series · 3 of 3 positions shown · non-contrast
Comparison: None.

CLINICAL DATA: Fell walking to bathroom. LEFT hip pain and
deformity.

EXAM:
DG HIP (WITH OR WITHOUT PELVIS) 2-3V LEFT

[w hip lat left]
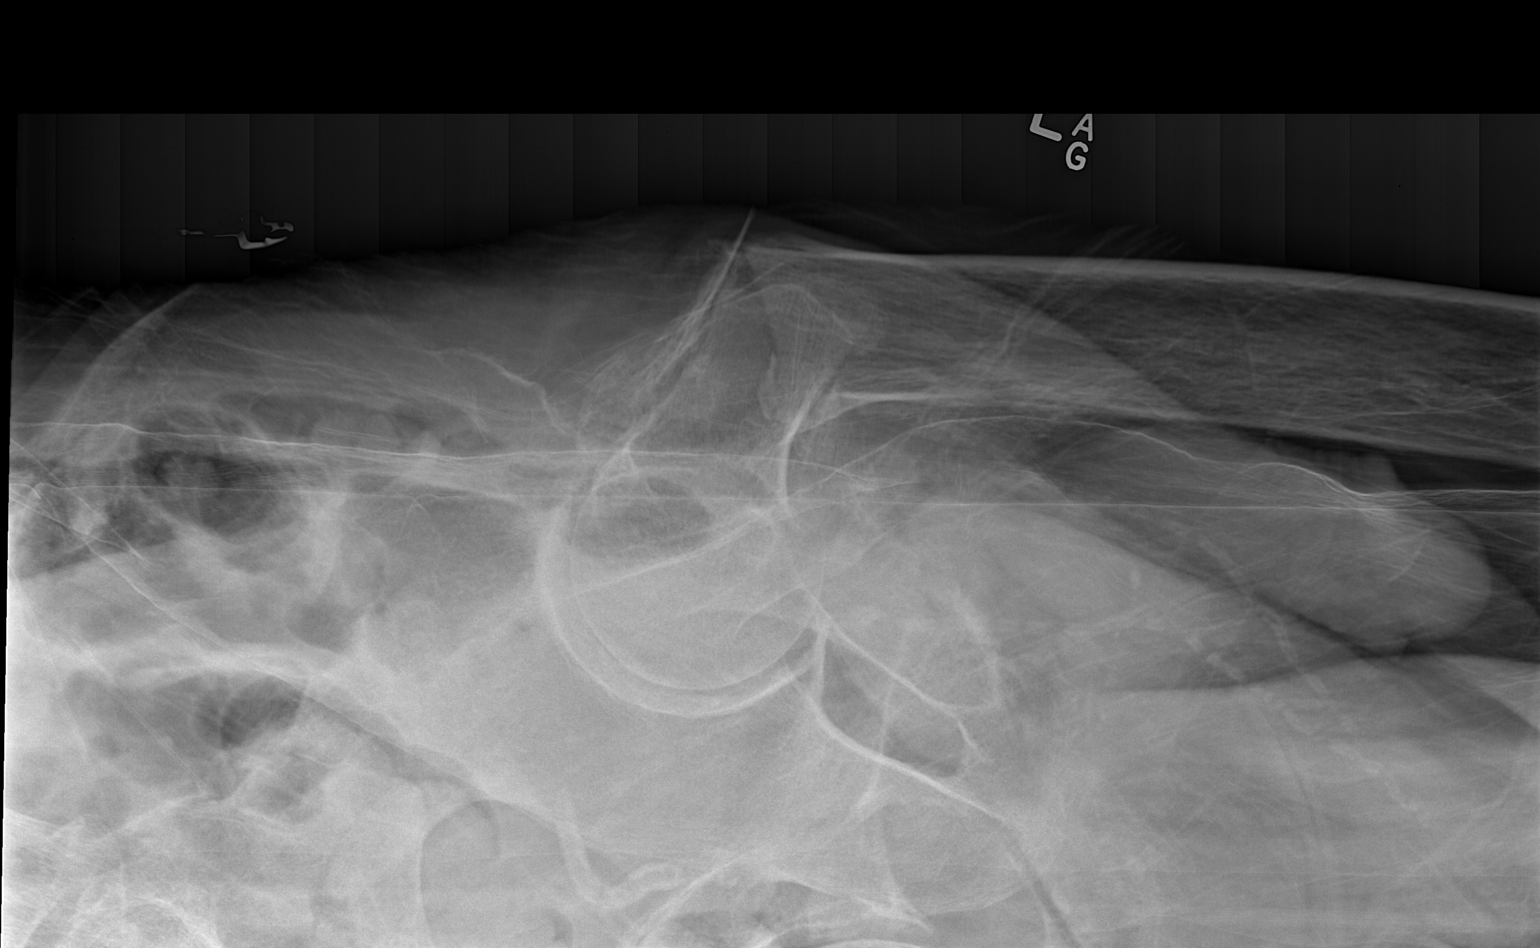

[x pelvis]
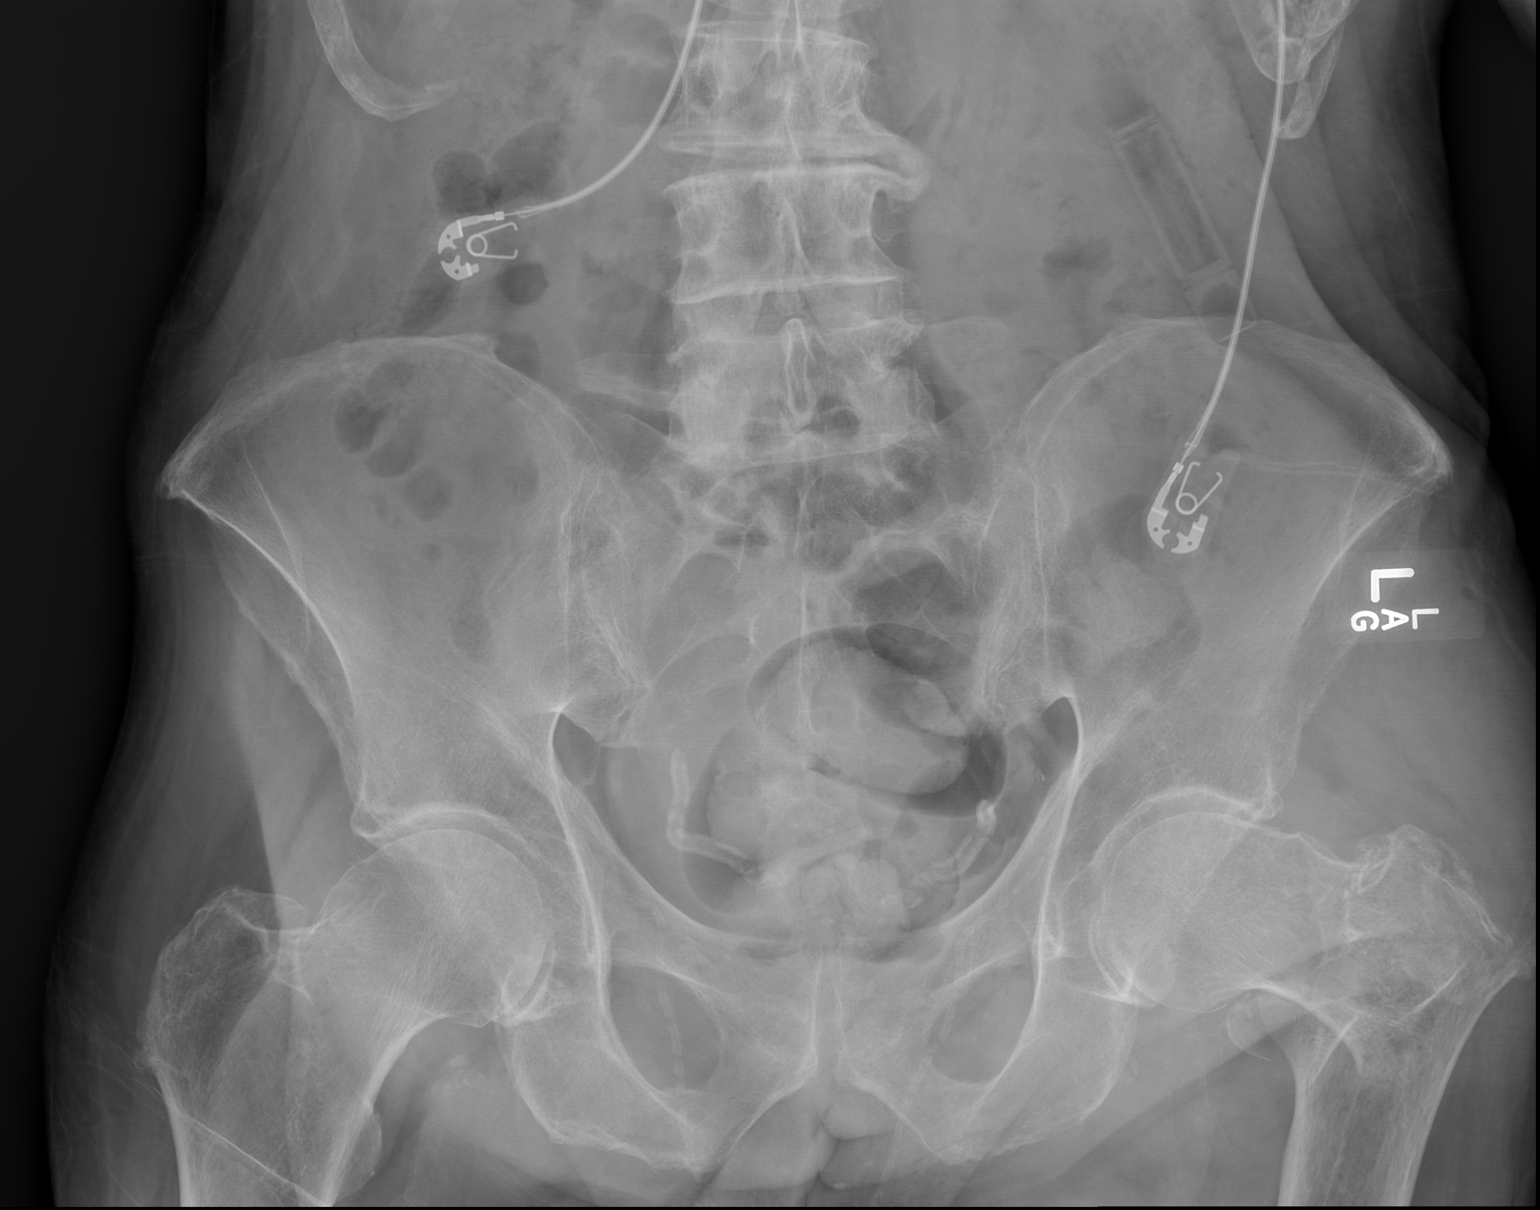

[x hip ap left]
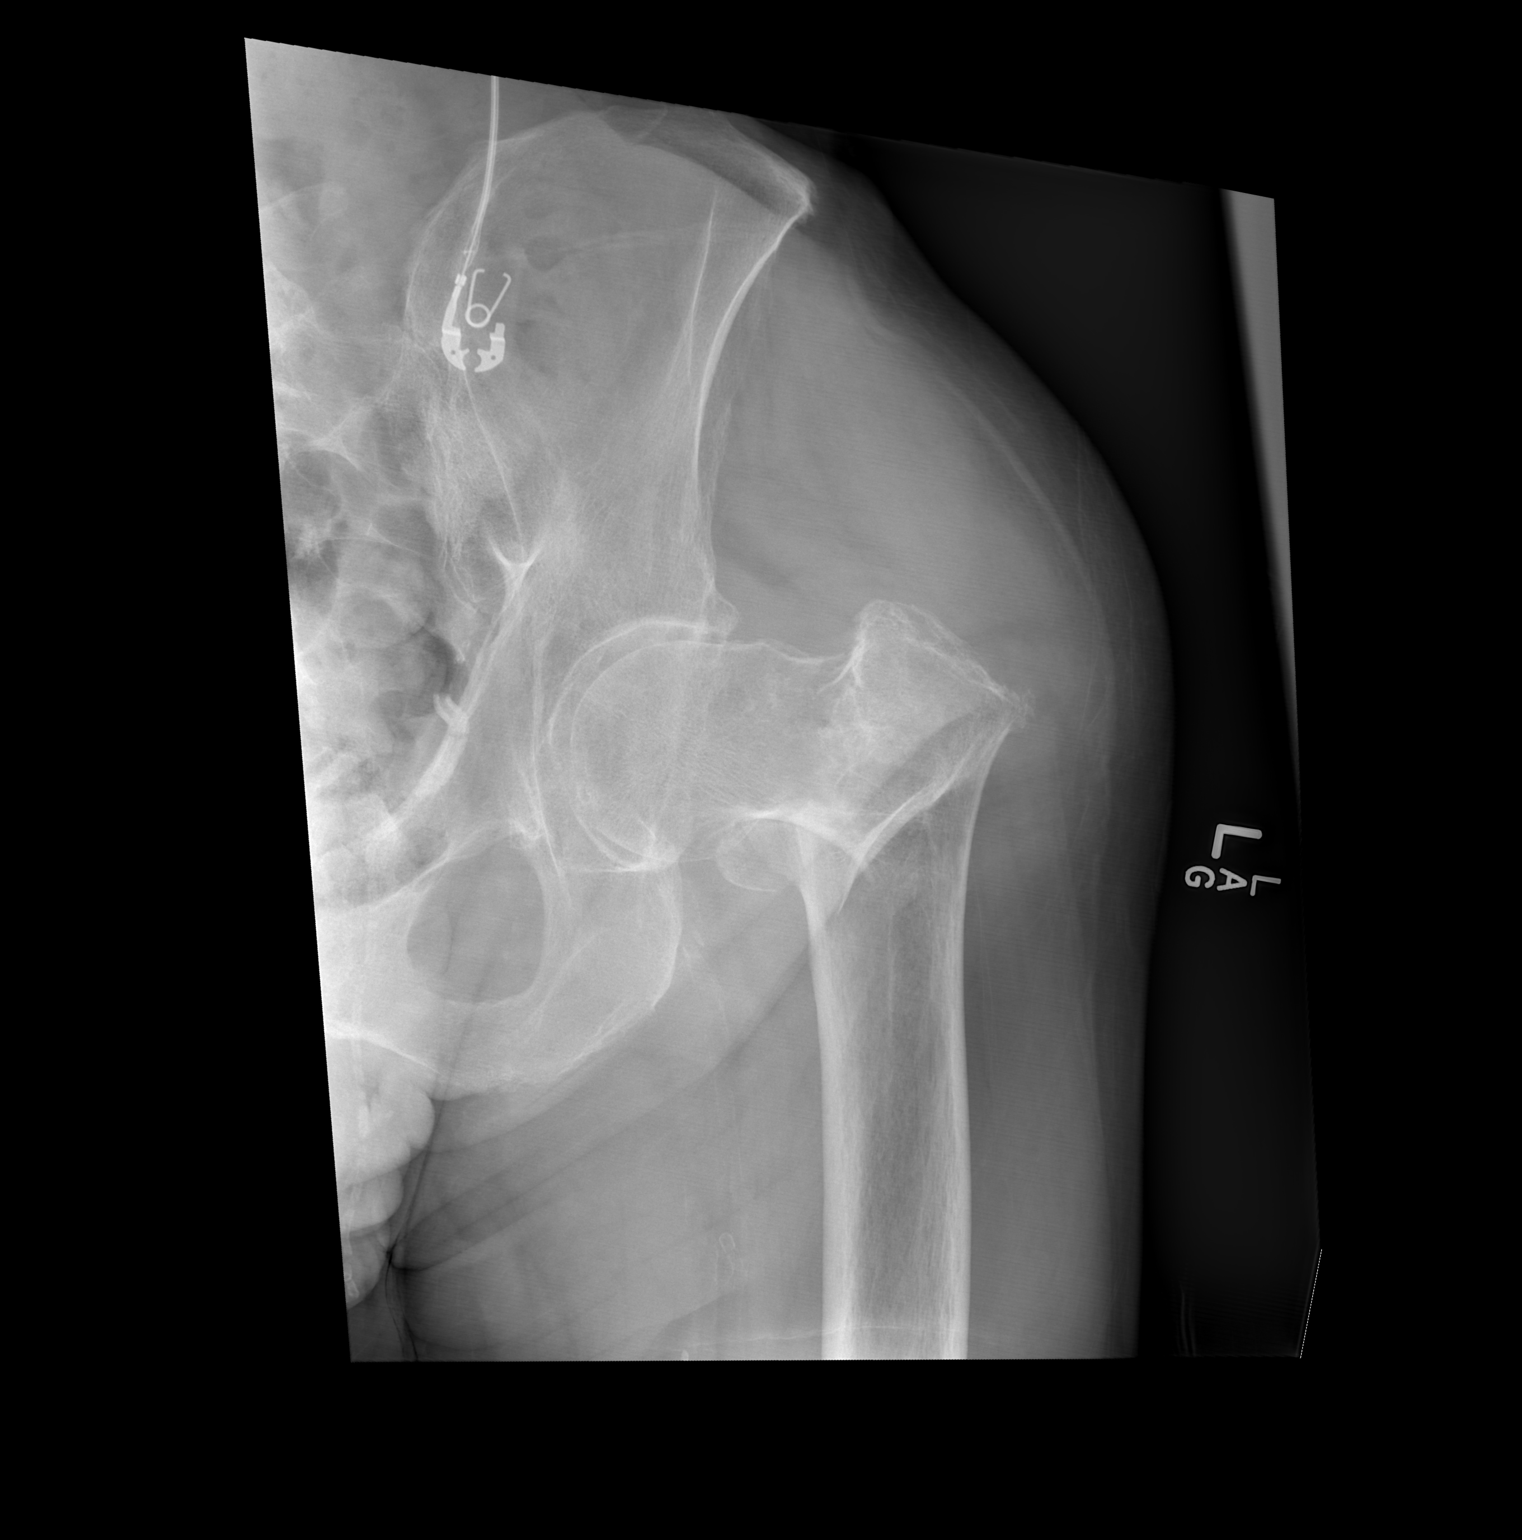

[3 of 3 positions shown; findings below may reference images not displayed]

FINDINGS: Acute comminuted LEFT femur intertrochanteric fracture with
impaction, varus angulation distal bony fragments. No dislocation.
Mild bilateral hip osteoarthrosis. Osteopenia without destructive
bony lesions. Plastic radiopaque foreign body projects in LEFT
abdomen, seen on single view. Moderate vascular calcifications.
IMPRESSION: Acute displaced LEFT femur intertrochanteric fracture. No
dislocation.

Plastic radiopaque foreign body projects in LEFT mid abdomen.
